# Patient Record
Sex: Male | Born: 1945 | Race: White | Hispanic: No | Marital: Married
Health system: Southern US, Community
[De-identification: ages and names within clinical notes are randomized; demographics above are authoritative.]

---

## 1998-09-27 ENCOUNTER — Ambulatory Visit (HOSPITAL_COMMUNITY): Admission: RE | Admit: 1998-09-27 | Discharge: 1998-09-27 | Payer: Self-pay | Admitting: *Deleted

## 1998-09-27 ENCOUNTER — Encounter: Payer: Self-pay | Admitting: *Deleted

## 2000-03-31 ENCOUNTER — Ambulatory Visit (HOSPITAL_COMMUNITY): Admission: RE | Admit: 2000-03-31 | Discharge: 2000-03-31 | Payer: Self-pay | Admitting: Neurosurgery

## 2000-03-31 ENCOUNTER — Encounter: Payer: Self-pay | Admitting: Neurosurgery

## 2000-04-15 ENCOUNTER — Encounter: Payer: Self-pay | Admitting: Neurosurgery

## 2000-04-15 ENCOUNTER — Inpatient Hospital Stay (HOSPITAL_COMMUNITY): Admission: RE | Admit: 2000-04-15 | Discharge: 2000-04-16 | Payer: Self-pay | Admitting: Neurosurgery

## 2000-05-19 ENCOUNTER — Encounter: Payer: Self-pay | Admitting: Neurosurgery

## 2000-05-19 ENCOUNTER — Encounter: Admission: RE | Admit: 2000-05-19 | Discharge: 2000-05-19 | Payer: Self-pay | Admitting: Neurosurgery

## 2000-09-25 ENCOUNTER — Inpatient Hospital Stay (HOSPITAL_COMMUNITY): Admission: EM | Admit: 2000-09-25 | Discharge: 2000-09-27 | Payer: Self-pay | Admitting: Emergency Medicine

## 2000-12-31 ENCOUNTER — Encounter: Payer: Self-pay | Admitting: Neurosurgery

## 2000-12-31 ENCOUNTER — Encounter: Admission: RE | Admit: 2000-12-31 | Discharge: 2000-12-31 | Payer: Self-pay | Admitting: Neurosurgery

## 2001-07-16 ENCOUNTER — Encounter: Payer: Self-pay | Admitting: Neurosurgery

## 2001-07-16 ENCOUNTER — Inpatient Hospital Stay (HOSPITAL_COMMUNITY): Admission: RE | Admit: 2001-07-16 | Discharge: 2001-07-19 | Payer: Self-pay | Admitting: Neurosurgery

## 2001-11-18 ENCOUNTER — Encounter: Admission: RE | Admit: 2001-11-18 | Discharge: 2001-11-18 | Payer: Self-pay | Admitting: Neurosurgery

## 2001-11-18 ENCOUNTER — Encounter: Payer: Self-pay | Admitting: Neurosurgery

## 2004-05-01 ENCOUNTER — Ambulatory Visit: Payer: Self-pay | Admitting: Family Medicine

## 2004-05-07 ENCOUNTER — Ambulatory Visit: Payer: Self-pay | Admitting: Family Medicine

## 2004-08-14 ENCOUNTER — Encounter: Admission: RE | Admit: 2004-08-14 | Discharge: 2004-08-14 | Payer: Self-pay | Admitting: Orthopaedic Surgery

## 2004-09-17 ENCOUNTER — Ambulatory Visit: Payer: Self-pay | Admitting: Family Medicine

## 2004-09-21 ENCOUNTER — Ambulatory Visit: Payer: Self-pay | Admitting: Family Medicine

## 2004-09-26 ENCOUNTER — Ambulatory Visit: Payer: Self-pay | Admitting: Family Medicine

## 2004-10-01 ENCOUNTER — Ambulatory Visit: Payer: Self-pay | Admitting: Family Medicine

## 2004-10-16 ENCOUNTER — Ambulatory Visit: Payer: Self-pay | Admitting: Family Medicine

## 2004-10-30 ENCOUNTER — Ambulatory Visit: Payer: Self-pay | Admitting: Family Medicine

## 2004-11-29 ENCOUNTER — Ambulatory Visit: Payer: Self-pay | Admitting: Family Medicine

## 2005-04-29 ENCOUNTER — Ambulatory Visit: Payer: Self-pay | Admitting: Family Medicine

## 2005-08-20 ENCOUNTER — Encounter: Admission: RE | Admit: 2005-08-20 | Discharge: 2005-08-20 | Payer: Self-pay | Admitting: Neurosurgery

## 2007-05-02 IMAGING — CT CT CERVICAL SPINE W/O CM
3 of 5 series · 12 of 28 positions shown, 14 images · IV contrast (agent unspecified)
Comparison: Myelogram CT of 08/14/04.

CLINICAL DATA: Neck and arm pain worse on the right with some right sided weakness. 
CT SCAN OF THE CERVICAL SPINE WITHOUT CONTRAST:
TECHNIQUE: Multidetector CT imaging of the cervical spine was performed.  Multiplanar CT  image reconstructions were also generated.

[Series 3: recon 2: cervical spine · axial · 0.23mm/px · z∈[+81,+149]mm · 2 of 163 slices shown]
[im 55/163  bone]
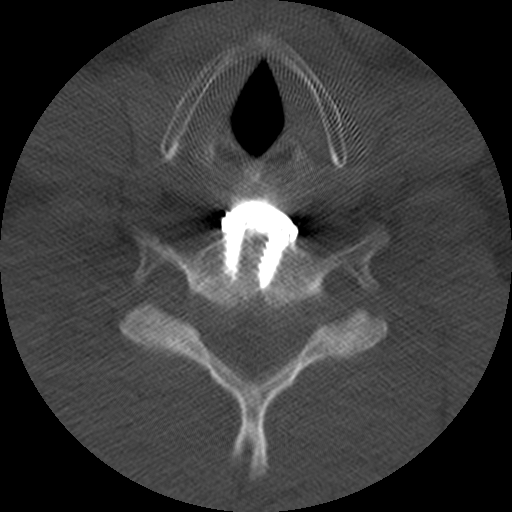
[im 109/163  bone]
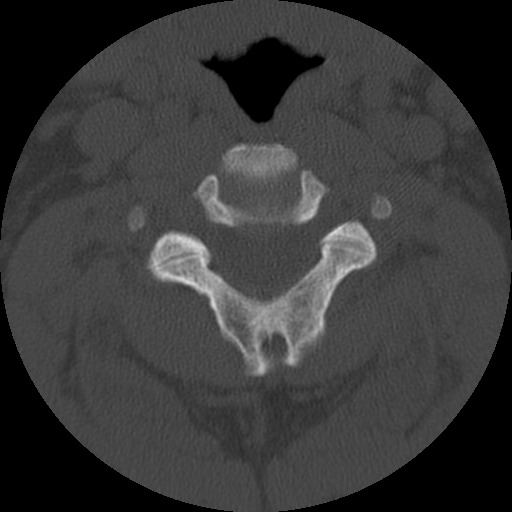

[Series 4: recon 3: cervical spine · axial · 0.23mm/px · z∈[+47,+182]mm · 5 of 325 slices shown, 7 images]
[im 55/325  soft-tissue]
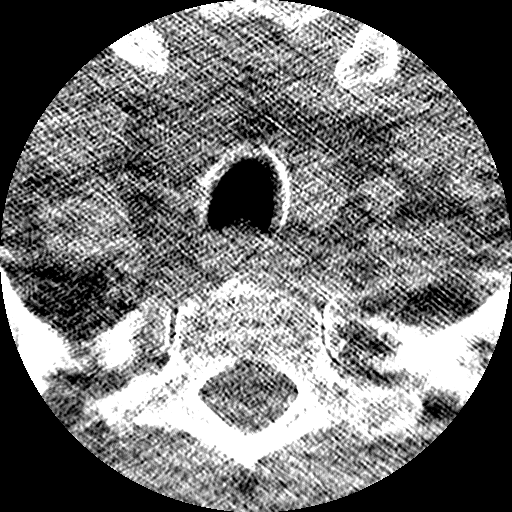
[im 55/325  bone]
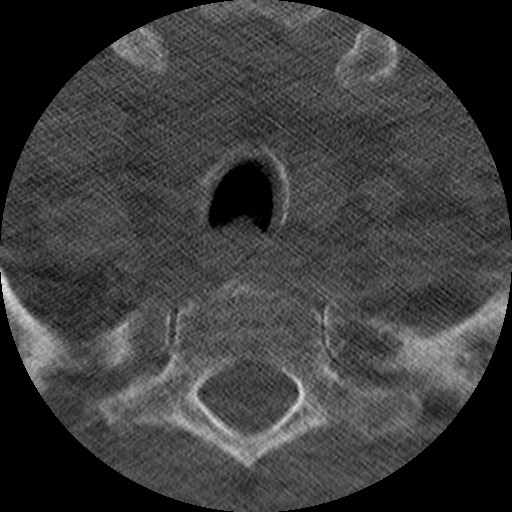
[im 109/325  bone]
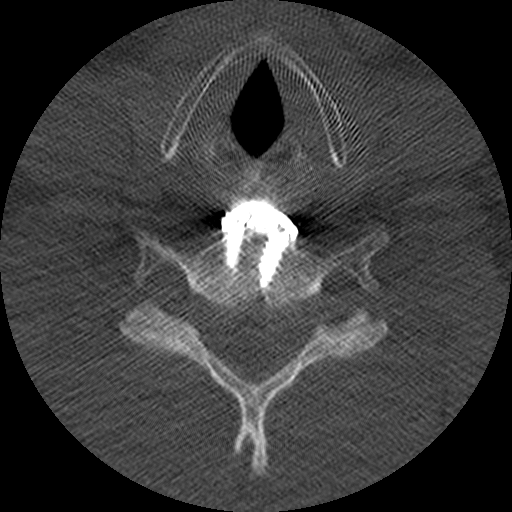
[im 163/325  bone]
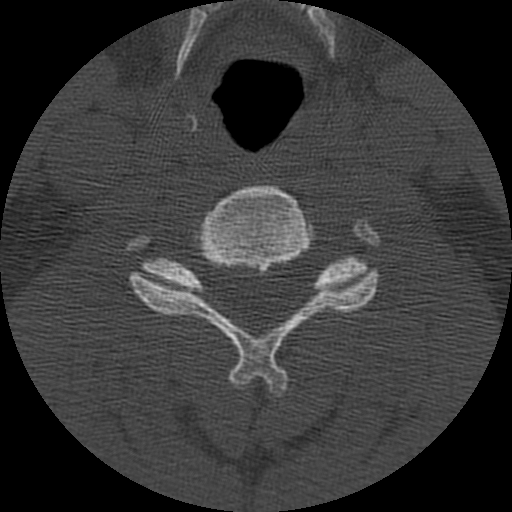
[im 217/325  bone]
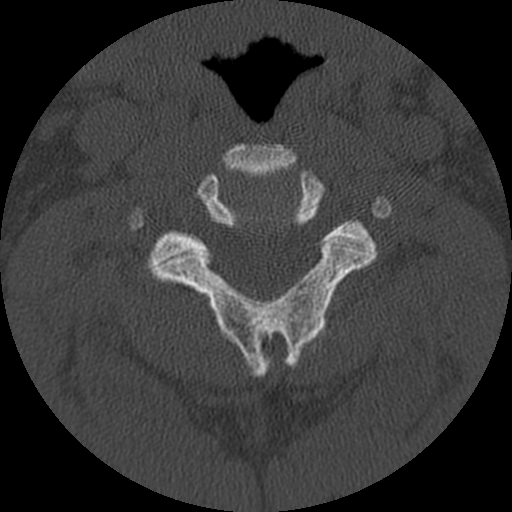
[im 271/325  soft-tissue]
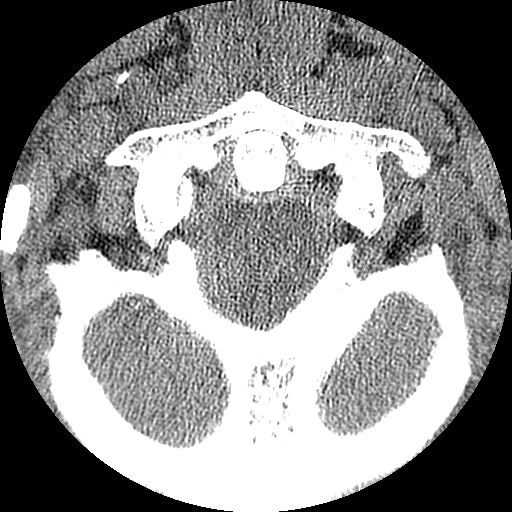
[im 271/325  bone]
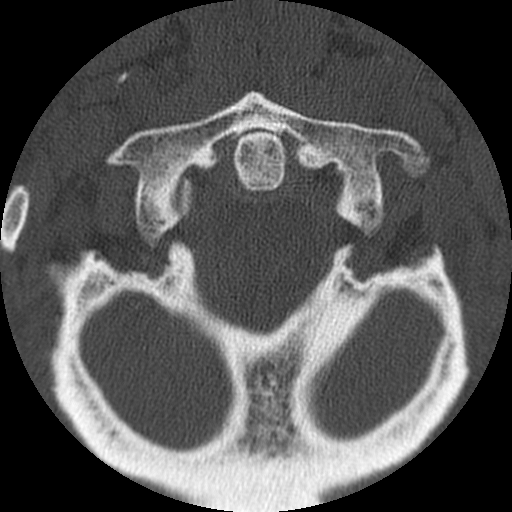

[Series 400: reformatted · sagittal · 0.41mm/px · 5 of 40 slices shown]
[im 7/40  bone]
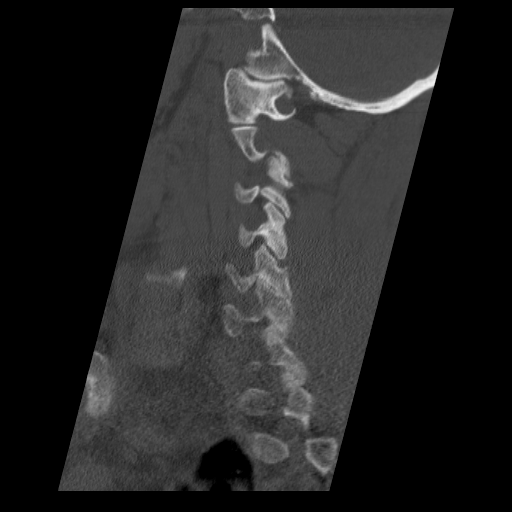
[im 14/40  bone]
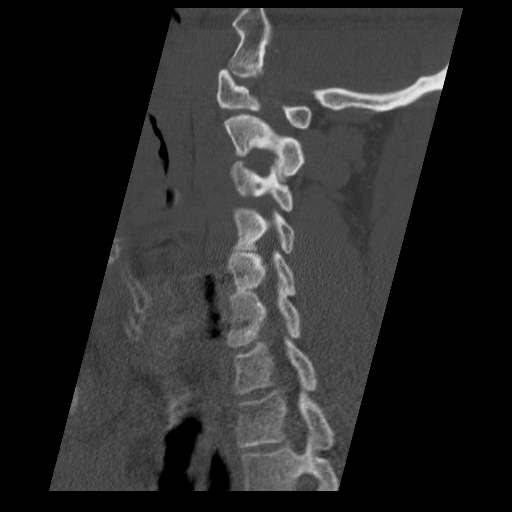
[im 20/40  bone]
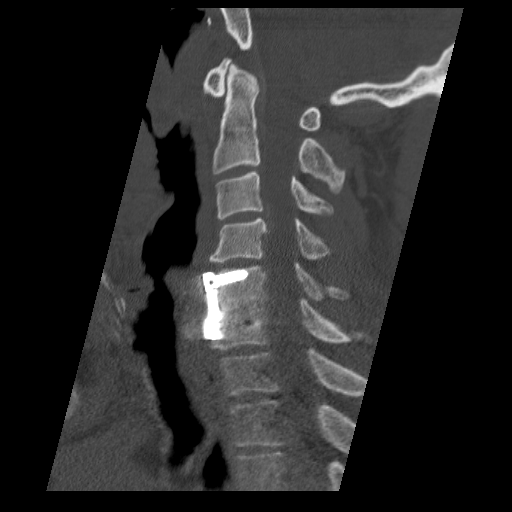
[im 27/40  bone]
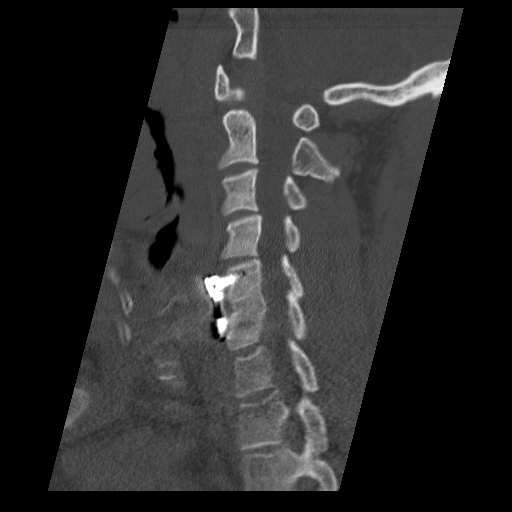
[im 33/40  bone]
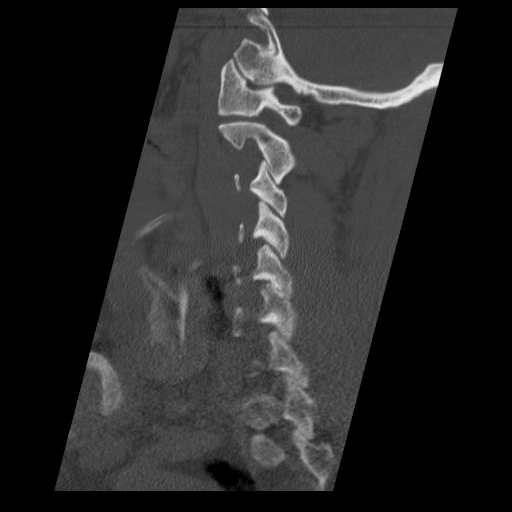

[12 of 28 positions shown; findings below may reference images not displayed]

FINDINGS: This was intended to be a post myelogram CT, but due to a mixed injection with very little subarachnoid contrast, I think this was best interpreted as a noncontrast CT.  The patient will only be charged for that procedure.  There will be no myelogram charge. 
The foramen magnum is widely patent.  There are ordinary mild degenerative changes between the dens and the anterior arch of C1.  The spinal canal is widely patent. 
C2-3:  There is a small central bulge that indents the ventral subarachnoid space, but does not compress the cord or show foraminal extension.  
C3-4:  There is a large central to left paracentral disk herniation which is calcified.  This indents the thecal sac and flattens the cord centrally and towards the left. Because the disk is calcified, we were able to compare this fairly accurately with the previous CT and I do not think there has been any changed.  No foraminal compromise.  The disk extends from the lower third of C3 down as far as the upper third of C4. 
C4-5:  There is spondylosis with endplate osteophytes and shallow protrusion of disk material that indents the ventral subarachnoid space.  There is osteophytic encroachment upon the foramina, more on the right than on the left.  
There has prior fusion at C5-6 which is solid.  The canal and foramina are widely patent.  
The C6-7 level is difficult to evaluate because of the patient?s large size and shoulder mass.  I do not suspect that there is any herniated disk material or stenosis  at this level.  The foramina appear widely patent. 
C7-T1:  Again, evaluation is difficult because the patient?s large size.  No disk pathology is suspected and the foramina appear widely patent.
IMPRESSION: 1.  This is a noncontrast CT of the neck because of difficulties described above.  Nonetheless, particularly in comparing with the myelogram of 08/14/04, I think we can be sufficiently confident of the findings.
2.  I do not think there has been any change since last [REDACTED].  The dominant finding is a central to left paracentral disk herniation at C3-4 with slight upward and downward extension of disk material.  This causes a marked impression upon the thecal sac and deforms the cord centrally and towards the left.  The disk is partially calcified.  
3.  Disk bulge at C2-3 without canal or foraminal encroachment of any significance.
4.  Spondylosis at C4-5 with osteophytes and bulging disk material that narrow the ventral subarachnoid space, but do not appear to compress or deform the cord.  There is osteophytic encroachment upon the foramina, more on the right than on the left.

## 2015-07-18 DIAGNOSIS — J9600 Acute respiratory failure, unspecified whether with hypoxia or hypercapnia: Secondary | ICD-10-CM | POA: Diagnosis not present

## 2015-07-18 DIAGNOSIS — E119 Type 2 diabetes mellitus without complications: Secondary | ICD-10-CM | POA: Diagnosis not present

## 2015-07-18 DIAGNOSIS — I4891 Unspecified atrial fibrillation: Secondary | ICD-10-CM | POA: Diagnosis not present

## 2015-07-18 DIAGNOSIS — J449 Chronic obstructive pulmonary disease, unspecified: Secondary | ICD-10-CM | POA: Diagnosis not present

## 2015-07-20 DIAGNOSIS — I4892 Unspecified atrial flutter: Secondary | ICD-10-CM

## 2015-07-20 DIAGNOSIS — J449 Chronic obstructive pulmonary disease, unspecified: Secondary | ICD-10-CM

## 2015-07-20 DIAGNOSIS — N183 Chronic kidney disease, stage 3 (moderate): Secondary | ICD-10-CM

## 2015-07-20 DIAGNOSIS — E119 Type 2 diabetes mellitus without complications: Secondary | ICD-10-CM

## 2015-07-20 DIAGNOSIS — I4891 Unspecified atrial fibrillation: Secondary | ICD-10-CM

## 2015-07-20 DIAGNOSIS — N179 Acute kidney failure, unspecified: Secondary | ICD-10-CM

## 2015-07-21 ENCOUNTER — Inpatient Hospital Stay (HOSPITAL_COMMUNITY): Payer: Commercial Managed Care - HMO

## 2015-07-21 ENCOUNTER — Inpatient Hospital Stay (HOSPITAL_COMMUNITY)
Admission: AD | Admit: 2015-07-21 | Discharge: 2015-08-05 | DRG: 871 | Disposition: E | Payer: Commercial Managed Care - HMO | Source: Other Acute Inpatient Hospital | Attending: Pulmonary Disease | Admitting: Pulmonary Disease

## 2015-07-21 ENCOUNTER — Other Ambulatory Visit (HOSPITAL_COMMUNITY): Payer: Commercial Managed Care - HMO

## 2015-07-21 DIAGNOSIS — D689 Coagulation defect, unspecified: Secondary | ICD-10-CM | POA: Diagnosis not present

## 2015-07-21 DIAGNOSIS — E1122 Type 2 diabetes mellitus with diabetic chronic kidney disease: Secondary | ICD-10-CM | POA: Diagnosis present

## 2015-07-21 DIAGNOSIS — J9601 Acute respiratory failure with hypoxia: Secondary | ICD-10-CM

## 2015-07-21 DIAGNOSIS — J449 Chronic obstructive pulmonary disease, unspecified: Secondary | ICD-10-CM | POA: Diagnosis present

## 2015-07-21 DIAGNOSIS — G9341 Metabolic encephalopathy: Secondary | ICD-10-CM | POA: Diagnosis not present

## 2015-07-21 DIAGNOSIS — F17200 Nicotine dependence, unspecified, uncomplicated: Secondary | ICD-10-CM | POA: Diagnosis present

## 2015-07-21 DIAGNOSIS — I4891 Unspecified atrial fibrillation: Secondary | ICD-10-CM | POA: Diagnosis present

## 2015-07-21 DIAGNOSIS — I5022 Chronic systolic (congestive) heart failure: Secondary | ICD-10-CM | POA: Diagnosis present

## 2015-07-21 DIAGNOSIS — Z86711 Personal history of pulmonary embolism: Secondary | ICD-10-CM

## 2015-07-21 DIAGNOSIS — Z6841 Body Mass Index (BMI) 40.0 and over, adult: Secondary | ICD-10-CM

## 2015-07-21 DIAGNOSIS — N181 Chronic kidney disease, stage 1: Secondary | ICD-10-CM | POA: Diagnosis present

## 2015-07-21 DIAGNOSIS — K7200 Acute and subacute hepatic failure without coma: Secondary | ICD-10-CM | POA: Diagnosis present

## 2015-07-21 DIAGNOSIS — J690 Pneumonitis due to inhalation of food and vomit: Secondary | ICD-10-CM | POA: Diagnosis not present

## 2015-07-21 DIAGNOSIS — R570 Cardiogenic shock: Secondary | ICD-10-CM | POA: Diagnosis not present

## 2015-07-21 DIAGNOSIS — Z66 Do not resuscitate: Secondary | ICD-10-CM | POA: Diagnosis present

## 2015-07-21 DIAGNOSIS — Z515 Encounter for palliative care: Secondary | ICD-10-CM | POA: Diagnosis present

## 2015-07-21 DIAGNOSIS — J9690 Respiratory failure, unspecified, unspecified whether with hypoxia or hypercapnia: Secondary | ICD-10-CM

## 2015-07-21 DIAGNOSIS — R34 Anuria and oliguria: Secondary | ICD-10-CM | POA: Diagnosis present

## 2015-07-21 DIAGNOSIS — D649 Anemia, unspecified: Secondary | ICD-10-CM | POA: Diagnosis present

## 2015-07-21 DIAGNOSIS — A419 Sepsis, unspecified organism: Secondary | ICD-10-CM | POA: Diagnosis not present

## 2015-07-21 DIAGNOSIS — I878 Other specified disorders of veins: Secondary | ICD-10-CM | POA: Diagnosis present

## 2015-07-21 DIAGNOSIS — N179 Acute kidney failure, unspecified: Secondary | ICD-10-CM

## 2015-07-21 DIAGNOSIS — I509 Heart failure, unspecified: Secondary | ICD-10-CM | POA: Diagnosis not present

## 2015-07-21 DIAGNOSIS — R0602 Shortness of breath: Secondary | ICD-10-CM

## 2015-07-21 DIAGNOSIS — Z4659 Encounter for fitting and adjustment of other gastrointestinal appliance and device: Secondary | ICD-10-CM

## 2015-07-21 DIAGNOSIS — R6521 Severe sepsis with septic shock: Secondary | ICD-10-CM | POA: Diagnosis not present

## 2015-07-21 DIAGNOSIS — I130 Hypertensive heart and chronic kidney disease with heart failure and stage 1 through stage 4 chronic kidney disease, or unspecified chronic kidney disease: Secondary | ICD-10-CM | POA: Diagnosis not present

## 2015-07-21 DIAGNOSIS — E872 Acidosis: Secondary | ICD-10-CM | POA: Diagnosis present

## 2015-07-21 LAB — CBC
HCT: 51.9 % (ref 39.0–52.0)
HEMOGLOBIN: 16.4 g/dL (ref 13.0–17.0)
MCH: 28 pg (ref 26.0–34.0)
MCHC: 31.6 g/dL (ref 30.0–36.0)
MCV: 88.7 fL (ref 78.0–100.0)
PLATELETS: 206 10*3/uL (ref 150–400)
RBC: 5.85 MIL/uL — AB (ref 4.22–5.81)
RDW: 15.4 % (ref 11.5–15.5)
WBC: 23 10*3/uL — AB (ref 4.0–10.5)

## 2015-07-21 LAB — COMPREHENSIVE METABOLIC PANEL
ALBUMIN: 2.7 g/dL — AB (ref 3.5–5.0)
ALK PHOS: 121 U/L (ref 38–126)
ALT: 2949 U/L — AB (ref 17–63)
ANION GAP: 28 — AB (ref 5–15)
AST: 7292 U/L — ABNORMAL HIGH (ref 15–41)
BILIRUBIN TOTAL: 3.4 mg/dL — AB (ref 0.3–1.2)
BUN: 79 mg/dL — ABNORMAL HIGH (ref 6–20)
CALCIUM: 7.4 mg/dL — AB (ref 8.9–10.3)
CO2: 21 mmol/L — AB (ref 22–32)
Chloride: 89 mmol/L — ABNORMAL LOW (ref 101–111)
Creatinine, Ser: 3.69 mg/dL — ABNORMAL HIGH (ref 0.61–1.24)
GFR calc Af Amer: 18 mL/min — ABNORMAL LOW (ref >60)
GFR calc non Af Amer: 15 mL/min — ABNORMAL LOW (ref >60)
GLUCOSE: 109 mg/dL — AB (ref 65–99)
Potassium: 4.3 mmol/L (ref 3.5–5.1)
SODIUM: 138 mmol/L (ref 135–145)
TOTAL PROTEIN: 5.6 g/dL — AB (ref 6.5–8.1)

## 2015-07-21 LAB — APTT: APTT: 58 s — AB (ref 24–37)

## 2015-07-21 LAB — POCT I-STAT 3, ART BLOOD GAS (G3+)
ACID-BASE DEFICIT: 5 mmol/L — AB (ref 0.0–2.0)
ACID-BASE DEFICIT: 9 mmol/L — AB (ref 0.0–2.0)
BICARBONATE: 16.9 meq/L — AB (ref 20.0–24.0)
Bicarbonate: 21.1 mEq/L (ref 20.0–24.0)
O2 SAT: 98 %
O2 Saturation: 97 %
PCO2 ART: 43.2 mmHg (ref 35.0–45.0)
PH ART: 7.296 — AB (ref 7.350–7.450)
TCO2: 22 mmol/L (ref 0–100)
TCO2: 18 mmol/L (ref 0–100)
pCO2 arterial: 35.5 mmHg (ref 35.0–45.0)
pH, Arterial: 7.284 — ABNORMAL LOW (ref 7.350–7.450)
pO2, Arterial: 126 mmHg — ABNORMAL HIGH (ref 80.0–100.0)
pO2, Arterial: 95 mmHg (ref 80.0–100.0)

## 2015-07-21 LAB — LACTIC ACID, PLASMA
LACTIC ACID, VENOUS: 8.7 mmol/L — AB (ref 0.5–2.0)
LACTIC ACID, VENOUS: 11 mmol/L — AB (ref 0.5–2.0)

## 2015-07-21 LAB — RENAL FUNCTION PANEL
Albumin: 2.6 g/dL — ABNORMAL LOW (ref 3.5–5.0)
Anion gap: 27 — ABNORMAL HIGH (ref 5–15)
BUN: 84 mg/dL — ABNORMAL HIGH (ref 6–20)
CO2: 16 mmol/L — ABNORMAL LOW (ref 22–32)
Calcium: 6.5 mg/dL — ABNORMAL LOW (ref 8.9–10.3)
Chloride: 92 mmol/L — ABNORMAL LOW (ref 101–111)
Creatinine, Ser: 4 mg/dL — ABNORMAL HIGH (ref 0.61–1.24)
GFR calc Af Amer: 16 mL/min — ABNORMAL LOW (ref >60)
GFR calc non Af Amer: 14 mL/min — ABNORMAL LOW (ref >60)
Glucose, Bld: 80 mg/dL (ref 65–99)
Phosphorus: 10.6 mg/dL — ABNORMAL HIGH (ref 2.5–4.6)
Potassium: 4.6 mmol/L (ref 3.5–5.1)
Sodium: 135 mmol/L (ref 135–145)

## 2015-07-21 LAB — CARBOXYHEMOGLOBIN
Carboxyhemoglobin: 1.2 % (ref 0.5–1.5)
METHEMOGLOBIN: 0.5 % (ref 0.0–1.5)
O2 SAT: 83.4 %
TOTAL HEMOGLOBIN: 18 g/dL (ref 13.5–18.0)

## 2015-07-21 LAB — DIC (DISSEMINATED INTRAVASCULAR COAGULATION)PANEL
D-Dimer, Quant: >20 ug/mL-FEU — ABNORMAL HIGH (ref 0.00–0.50)
Fibrinogen: 208 mg/dL (ref 204–475)
Prothrombin Time: 36.5 seconds — ABNORMAL HIGH (ref 11.6–15.2)

## 2015-07-21 LAB — AMYLASE: Amylase: 339 U/L — ABNORMAL HIGH (ref 28–100)

## 2015-07-21 LAB — DIC (DISSEMINATED INTRAVASCULAR COAGULATION) PANEL
APTT: 40 s — AB (ref 24–37)
INR: 3.8 — AB (ref 0.00–1.49)
PLATELETS: 177 10*3/uL (ref 150–400)

## 2015-07-21 LAB — HEPARIN LEVEL (UNFRACTIONATED): HEPARIN UNFRACTIONATED: 0.1 [IU]/mL — AB (ref 0.30–0.70)

## 2015-07-21 LAB — VANCOMYCIN, RANDOM: VANCOMYCIN RM: 28 ug/mL

## 2015-07-21 LAB — TROPONIN I
TROPONIN I: 0.84 ng/mL — AB (ref <0.031)
TROPONIN I: 1.03 ng/mL — AB (ref <0.031)
Troponin I: 0.57 ng/mL (ref <0.031)

## 2015-07-21 LAB — MAGNESIUM: Magnesium: 1.9 mg/dL (ref 1.7–2.4)

## 2015-07-21 LAB — GLUCOSE, CAPILLARY
GLUCOSE-CAPILLARY: 126 mg/dL — AB (ref 65–99)
GLUCOSE-CAPILLARY: 74 mg/dL (ref 65–99)
GLUCOSE-CAPILLARY: 76 mg/dL (ref 65–99)
Glucose-Capillary: 103 mg/dL — ABNORMAL HIGH (ref 65–99)

## 2015-07-21 LAB — CORTISOL: Cortisol, Plasma: >100 ug/dL

## 2015-07-21 LAB — PROTIME-INR
INR: 3.02 — ABNORMAL HIGH (ref 0.00–1.49)
PROTHROMBIN TIME: 30.7 s — AB (ref 11.6–15.2)

## 2015-07-21 LAB — LIPASE, BLOOD: Lipase: 172 U/L — ABNORMAL HIGH (ref 11–51)

## 2015-07-21 LAB — PHOSPHORUS: Phosphorus: 10.9 mg/dL — ABNORMAL HIGH (ref 2.5–4.6)

## 2015-07-21 LAB — PROCALCITONIN: Procalcitonin: 2.17 ng/mL

## 2015-07-21 LAB — MRSA PCR SCREENING: MRSA BY PCR: NEGATIVE

## 2015-07-21 MED ORDER — HEPARIN BOLUS VIA INFUSION (CRRT): 4000.0000 [IU] | INTRAVENOUS | Status: DC | PRN

## 2015-07-21 MED ORDER — PRISMASOL BGK 4/2.5 32-4-2.5 MEQ/L IV SOLN
INTRAVENOUS | Status: DC
  Administered 2015-07-22 (×2): via INTRAVENOUS_CENTRAL
  Filled 2015-07-21 (×5): qty 5000

## 2015-07-21 MED ORDER — CHLORHEXIDINE GLUCONATE 0.12% ORAL RINSE (MEDLINE KIT)
15.0000 mL | Freq: Two times a day (BID) | OROMUCOSAL | Status: DC
  Administered 2015-07-21: 15 mL via OROMUCOSAL

## 2015-07-21 MED ORDER — INSULIN ASPART 100 UNIT/ML ~~LOC~~ SOLN
0.0000 [IU] | SUBCUTANEOUS | Status: DC
  Administered 2015-07-21: 2 [IU] via SUBCUTANEOUS

## 2015-07-21 MED ORDER — HYDROCORTISONE NA SUCCINATE PF 100 MG IJ SOLR
50.0000 mg | Freq: Four times a day (QID) | INTRAMUSCULAR | Status: DC
  Administered 2015-07-21 – 2015-07-22 (×6): 50 mg via INTRAVENOUS
  Filled 2015-07-21 (×3): qty 1
  Filled 2015-07-21: qty 2
  Filled 2015-07-21 (×2): qty 1

## 2015-07-21 MED ORDER — MIDAZOLAM HCL 2 MG/2ML IJ SOLN: 1.0000 mg | INTRAMUSCULAR | Status: DC | PRN

## 2015-07-21 MED ORDER — SODIUM CHLORIDE 0.9 % IJ SOLN
250.0000 [IU]/h | INTRAMUSCULAR | Status: DC
  Filled 2015-07-21: qty 2

## 2015-07-21 MED ORDER — PRISMASOL BGK 4/2.5 32-4-2.5 MEQ/L IV SOLN
INTRAVENOUS | Status: DC
  Administered 2015-07-21 – 2015-07-22 (×8): via INTRAVENOUS_CENTRAL
  Filled 2015-07-21 (×15): qty 5000

## 2015-07-21 MED ORDER — ANTISEPTIC ORAL RINSE SOLUTION (CORINZ)
7.0000 mL | Freq: Four times a day (QID) | OROMUCOSAL | Status: DC
Start: 2015-07-22 — End: 2015-07-21

## 2015-07-21 MED ORDER — HEPARIN BOLUS VIA INFUSION (CRRT)
4000.0000 [IU] | Freq: Once | INTRAVENOUS | Status: AC
  Administered 2015-07-21: 4000 [IU] via INTRAVENOUS_CENTRAL
  Filled 2015-07-21: qty 4000

## 2015-07-21 MED ORDER — SODIUM CHLORIDE 0.9 % IV SOLN
INTRAVENOUS | Status: DC
  Administered 2015-07-21 – 2015-07-22 (×2): via INTRAVENOUS

## 2015-07-21 MED ORDER — PRISMASOL BGK 4/2.5 32-4-2.5 MEQ/L IV SOLN
INTRAVENOUS | Status: DC
  Filled 2015-07-21: qty 5000

## 2015-07-21 MED ORDER — PIPERACILLIN-TAZOBACTAM IN DEX 2-0.25 GM/50ML IV SOLN
2.2500 g | Freq: Four times a day (QID) | INTRAVENOUS | Status: DC
  Administered 2015-07-21 – 2015-07-22 (×4): 2.25 g via INTRAVENOUS
  Filled 2015-07-21 (×6): qty 50

## 2015-07-21 MED ORDER — PERFLUTREN LIPID MICROSPHERE
1.0000 mL | INTRAVENOUS | Status: AC | PRN
  Administered 2015-07-21: 2 mL via INTRAVENOUS
  Filled 2015-07-21: qty 10

## 2015-07-21 MED ORDER — CHLORHEXIDINE GLUCONATE 0.12% ORAL RINSE (MEDLINE KIT)
15.0000 mL | Freq: Two times a day (BID) | OROMUCOSAL | Status: DC
Start: 2015-07-21 — End: 2015-07-22
  Administered 2015-07-22: 15 mL via OROMUCOSAL

## 2015-07-21 MED ORDER — ANTISEPTIC ORAL RINSE SOLUTION (CORINZ)
7.0000 mL | OROMUCOSAL | Status: DC
  Administered 2015-07-21 – 2015-07-22 (×9): 7 mL via OROMUCOSAL

## 2015-07-21 MED ORDER — FAMOTIDINE IN NACL 20-0.9 MG/50ML-% IV SOLN
20.0000 mg | INTRAVENOUS | Status: DC
  Administered 2015-07-21 – 2015-07-22 (×2): 20 mg via INTRAVENOUS
  Filled 2015-07-21 (×2): qty 50

## 2015-07-21 MED ORDER — HEPARIN (PORCINE) 2000 UNITS/L FOR CRRT
INTRAVENOUS_CENTRAL | Status: DC | PRN
  Administered 2015-07-21 (×2): via INTRAVENOUS_CENTRAL
  Filled 2015-07-21 (×3): qty 1000

## 2015-07-21 MED ORDER — VASOPRESSIN 20 UNIT/ML IV SOLN
0.0300 [IU]/min | INTRAVENOUS | Status: DC
  Administered 2015-07-21 – 2015-07-22 (×2): 0.03 [IU]/min via INTRAVENOUS
  Filled 2015-07-21 (×2): qty 2

## 2015-07-21 MED ORDER — PIPERACILLIN-TAZOBACTAM 3.375 G IVPB
3.3750 g | Freq: Three times a day (TID) | INTRAVENOUS | Status: DC
Start: 2015-07-21 — End: 2015-07-21
  Administered 2015-07-21: 3.375 g via INTRAVENOUS
  Filled 2015-07-21 (×3): qty 50

## 2015-07-21 MED ORDER — NOREPINEPHRINE BITARTRATE 1 MG/ML IV SOLN
0.0000 ug/min | INTRAVENOUS | Status: DC
  Administered 2015-07-21: 40 ug/min via INTRAVENOUS
  Administered 2015-07-21: 30 ug/min via INTRAVENOUS
  Administered 2015-07-22 (×2): 38 ug/min via INTRAVENOUS
  Filled 2015-07-21 (×5): qty 16

## 2015-07-21 MED ORDER — FENTANYL CITRATE (PF) 100 MCG/2ML IJ SOLN: 50.0000 ug | INTRAMUSCULAR | Status: DC | PRN

## 2015-07-21 MED ORDER — NOREPINEPHRINE BITARTRATE 1 MG/ML IV SOLN
5.0000 ug/min | INTRAVENOUS | Status: DC
  Filled 2015-07-21: qty 4

## 2015-07-21 MED ORDER — PRISMASOL BGK 4/2.5 32-4-2.5 MEQ/L IV SOLN
INTRAVENOUS | Status: DC
  Administered 2015-07-21: 14:00:00 via INTRAVENOUS_CENTRAL
  Filled 2015-07-21: qty 5000

## 2015-07-21 MED ORDER — HEPARIN BOLUS VIA INFUSION (CRRT)
1000.0000 [IU] | INTRAVENOUS | Status: DC | PRN
  Filled 2015-07-21: qty 1000

## 2015-07-21 MED ORDER — PRISMASOL BGK 4/2.5 32-4-2.5 MEQ/L IV SOLN
INTRAVENOUS | Status: DC
  Filled 2015-07-21 (×4): qty 5000

## 2015-07-21 MED ORDER — VECURONIUM BROMIDE 10 MG IV SOLR
7.0000 mg | Freq: Once | INTRAVENOUS | Status: AC
  Administered 2015-07-21: 7 mg via INTRAVENOUS

## 2015-07-21 MED ORDER — IPRATROPIUM-ALBUTEROL 0.5-2.5 (3) MG/3ML IN SOLN
3.0000 mL | Freq: Four times a day (QID) | RESPIRATORY_TRACT | Status: DC
  Administered 2015-07-21 – 2015-07-22 (×6): 3 mL via RESPIRATORY_TRACT
  Filled 2015-07-21 (×5): qty 3
  Filled 2015-07-21: qty 42
  Filled 2015-07-21: qty 3

## 2015-07-21 MED ORDER — HEPARIN SODIUM (PORCINE) 1000 UNIT/ML DIALYSIS
1000.0000 [IU] | INTRAMUSCULAR | Status: DC | PRN
  Administered 2015-07-21: 3200 [IU] via INTRAVENOUS_CENTRAL
  Filled 2015-07-21 (×2): qty 6
  Filled 2015-07-21: qty 4

## 2015-07-21 MED ORDER — FENTANYL CITRATE (PF) 100 MCG/2ML IJ SOLN
50.0000 ug | INTRAMUSCULAR | Status: DC | PRN
  Administered 2015-07-21: 50 ug via INTRAVENOUS
  Filled 2015-07-21: qty 2

## 2015-07-21 MED ORDER — SODIUM CHLORIDE 0.9 % IJ SOLN
250.0000 [IU]/h | INTRAMUSCULAR | Status: DC
  Administered 2015-07-21: 1000 [IU]/h via INTRAVENOUS_CENTRAL
  Administered 2015-07-21: 250 [IU]/h via INTRAVENOUS_CENTRAL
  Administered 2015-07-22: 1300 [IU]/h via INTRAVENOUS_CENTRAL
  Administered 2015-07-22: 1600 [IU]/h via INTRAVENOUS_CENTRAL
  Administered 2015-07-22: 1650 [IU]/h via INTRAVENOUS_CENTRAL
  Filled 2015-07-21 (×4): qty 2

## 2015-07-21 MED ORDER — ONDANSETRON HCL 4 MG/2ML IJ SOLN: 4.0000 mg | Freq: Four times a day (QID) | INTRAMUSCULAR | Status: DC | PRN

## 2015-07-21 MED ORDER — HEPARIN SODIUM (PORCINE) 1000 UNIT/ML DIALYSIS
1000.0000 [IU] | INTRAMUSCULAR | Status: DC | PRN
  Filled 2015-07-21: qty 6

## 2015-07-21 MED ORDER — HEPARIN SODIUM (PORCINE) 1000 UNIT/ML DIALYSIS
1000.0000 [IU] | INTRAMUSCULAR | Status: DC | PRN
  Administered 2015-07-21: 3200 [IU] via INTRAVENOUS_CENTRAL
  Filled 2015-07-21: qty 4
  Filled 2015-07-21 (×2): qty 6

## 2015-07-21 MED ORDER — VECURONIUM BROMIDE 10 MG IV SOLR
INTRAVENOUS | Status: AC
  Filled 2015-07-21: qty 10

## 2015-07-21 NOTE — Progress Notes (Signed)
Echocardiogram 2D Echocardiogram has been performed.  Nathan Eaton 07/11/2015, 4:47 PM

## 2015-07-21 NOTE — H&P (Signed)
PULMONARY / CRITICAL CARE MEDICINE   Name: Nathan Eaton MRN: [id number] DOB: 07-Dec-1945    ADMISSION DATE:  07/30/2015   REF[place]ING MD: [place]  CHIEF COMPLAINT:  Sepsis  HISTORY OF PRESENT ILLNESS:   70 yo WM with an extensive PMH that includes  CHF ef 30%, DM, HTN, MO 297 ibs, COPD still smokes, venous stasis, PE on xarelto, Afib with RVR. He [facility] with 30 day hx of increasing lower ext edema and SOB. On 6/15 he rapidly declined and developed shock(presumed sepsis from aspiration)and required intubation and pressors. He has baseline creatine of 1.4 and rose to 2.3. He was transferred via [facility] to MICU on 40 mcg of levo, vasopressin and acidotic.   PAST MEDICAL HISTORY :  He  has no past medical history on file.  PAST SURGICAL HISTORY: He  has no past surgical history on file.  Allergies  Allergen Reactions  . Pravachol [Pravastatin Sodium]     No current facility-administered medications on file prior to encounter.   No current outpatient prescriptions on file prior to encounter.    FAMILY HISTORY:  His has no family status information on file.   SOCIAL HISTORY: Smoker Married REVIEW OF SYSTEMS:   NA  SUBJECTIVE:  MOWM on vent  VITAL SIGNS: There were no vitals taken for this visit.  HEMODYNAMICS:    VENTILATOR SETTINGS:    INTAKE / OUTPUT:    PHYSICAL EXAMINATION: General:MOWM on vent and sedated Neuro:  Follows some commands, mae x 4 HEENT:  OTT-> vent, OGT LIWS, Full beard Cardiovascular:  HSR RRR Lungs:Coarse rhonchi, decreased bs bases, faint wheezes Abdomen:Obese +bs Musculoskeletal:  intact Skin:  Lower ext with venous statis , weeping ulcers, no palpable lower ext pulses.  LABS:  BMET No results for input(s): NA, K, CL, CO2, BUN, CREATININE, GLUCOSE in the last 168 hours.  Electrolytes No results for input(s): CALCIUM, MG, PHOS in the last 168 hours.  CBC No results for input(s): WBC, HGB,  HCT, PLT in the last 168 hours.  Coag's No results for input(s): APTT, INR in the last 168 hours.  Sepsis Markers No results for input(s): LATICACIDVEN, PROCALCITON, O2SATVEN in the last 168 hours.  ABG  Recent Labs Lab 07/12/2015 0752  PHART 7.296*  PCO2ART 43.2  PO2ART 126.0*    Liver Enzymes No results for input(s): AST, ALT, ALKPHOS, BILITOT, ALBUMIN in the last 168 hours.  Cardiac Enzymes No results for input(s): TROPONINI, PROBNP in the last 168 hours.  Glucose No results for input(s): GLUCAP in the last 168 hours.  Imaging No results found.   STUDIES:  6/16 2 d>> 6/16 le a/v doppler>>   CULTURES: 6/15 bc x 2>> 6/15 uc> 6/15 sputum>>  ANTIBIOTICS: 6/15 vanco>> 6/15 zoysn>>  SIGNIFICANT EVENTS: 6/13 [facility] with pna resp distress 6/15 required intubation , pressor support and tx to [facility].  LINES/TUBES: 6/15 ott>> 6/15 rij cvl>> 6/15 rt fem aline>>  DISCUSSION: 70 yo MO (296 lbs) with multiple medical problems with shock, sepsis, pna, renal failure, tx from [place] [facility] 6/16  ASSESSMENT / PLAN:  PULMONARY A: Acute hypoxic resp failure Presumed aspiration pna COPD with continued tobacco abuse Asthma P:   Increase rr on vent for acidosis Vent bundle BD's  CARDIOVASCULAR A:  Shock from presumed sepsis CHF Known ef of 30 % Afib with RVR Lower ext venous statis and no pulse P:  Pressor support Careful with fluids Recheck 2 d Add  solu cortef for adrenal suppresion Lower ext doppler studies   RENAL No results found for: CREATININE A:   Acute renal failure with base line creatine 1.8 P:   Follow creatine Renal consult Hold heparin for possible HD cath  GASTROINTESTINAL A:   GI protection P:   H2 blocker  HEMATOLOGIC A:   Anticoagulation for PE P:  Heparin per pharmacy   INFECTIOUS A:   Aspiration pneumonia Weeping lower extremities  P:   V/Z day 0 Pan culture 6/16  ENDOCRINE A:    DM  Adrenal suppresion P:   SSI Solu cortef   NEUROLOGIC A:   Toxic metabolic encephalopathy  P:   RASS goal:-1 Sedate for tube tolerance   FAMILY  - Updates:   - Inter-disciplinary family meet or Palliative Care meeting due by:  June 23    Brett Canales Minor ACNP [facility] PCCM Pager [phone] till 3 pm If no answer page [phone] 07/11/2015, 8:03 AM

## 2015-07-21 NOTE — Progress Notes (Signed)
Notified MD [place] Nephrologist in regards to having issues running CRRT. Filters have clotted 3 times, even with priming with heparinized saline and utilizing heparinized syringe via the CRRT for anticoagulation. HD cath seems to operating correctly, able to flush and draw blood back. However when machine is starting to run, then filter begins to clot. After disconnecting Access and Return lines from HD cath, there has been clots forming at the end of line going to the CRRT and the HD cath ports. MD Kathrene BongoG[place]borough ordered a DIC and after results begin to come back, restart CRRT. May have to bolus with heparinized syringe initially.

## 2015-07-21 NOTE — Consult Note (Signed)
[facility] Renal Consultation Note  Requesting MD: Elsworth Soho Indication for Consultation: A on CRF in the setting of critical illness  HPI:  Nathan Eaton is a 70 y.o. male with past medical history significant for diabetes mellitus, hypertension, obesity, COPD with continued tobacco abuse, CHF with ejection fraction of 30%, PE on Xarelto, A. fib as well as chronic venous stasis with ulcers. He was admitted to [facility] on 6/13 with a several week history of increasing lower extremity edema and shortness of breath. He was noted to be on Lasix, lisinopril, and Zaroxolyn at that time. His initial creatinine was 1.4. On 6/15 he clinically declined and developed shock that was presumed to be sepsis from aspiration and required intubation and pressors. He also underwent a contrasted CT somewhere around this time.   He reportedly has been anuric for 24 hours- creatinine had increased to 2.3 and is noted to be 3.7 on his arrival here to [facility].  He is continued to be markedly hypotensive and has had no urine output.  History is obtained from the chart as patient is sedated on the vent.  Also of note vancomycin level was 28  CREATININE, SER  Date/Time Value Ref Range Status  07/28/2015 08:00 AM 3.69* 0.61 - 1.24 mg/dL Final     PMHx:  No past medical history on file.  No past surgical history on file.  Family Hx: No family history on file.  Social History:  has no tobacco, alcohol, and drug history on file.  Allergies:  Allergies  Allergen Reactions  . Pravachol [Pravastatin Sodium]     Medications: Prior to Admission medications   Not on File    I have reviewed the patient's current medications.  Labs:  Results for orders placed or performed during the hospital encounter of 07/13/2015 (from the past 48 hour(s))  I-STAT 3, arterial blood gas (G3+)     Status: Abnormal   Collection Time: 07/27/2015  7:52 AM  Result Value Ref Range   pH, Arterial 7.296 (L) 7.350 -  7.450   pCO2 arterial 43.2 35.0 - 45.0 mmHg   pO2, Arterial 126.0 (H) 80.0 - 100.0 mmHg   Bicarbonate 21.1 20.0 - 24.0 mEq/L   TCO2 22 0 - 100 mmol/L   O2 Saturation 98.0 %   Acid-base deficit 5.0 (H) 0.0 - 2.0 mmol/L   Patient temperature HIDE    Collection site ARTERIAL LINE    Drawn by Operator    Sample type ARTERIAL   MRSA PCR Screening     Status: None   Collection Time: 07/14/2015  7:54 AM  Result Value Ref Range   MRSA by PCR NEGATIVE NEGATIVE    Comment:        The GeneXpert MRSA Assay (FDA approved for NASAL specimens only), is one component of a comprehensive MRSA colonization surveillance program. It is not intended to diagnose MRSA infection nor to guide or monitor treatment for MRSA infections.   Comprehensive metabolic panel     Status: Abnormal   Collection Time: 07/12/2015  8:00 AM  Result Value Ref Range   Sodium 138 135 - 145 mmol/L   Potassium 4.3 3.5 - 5.1 mmol/L   Chloride 89 (L) 101 - 111 mmol/L   CO2 21 (L) 22 - 32 mmol/L   Glucose, Bld 109 (H) 65 - 99 mg/dL   BUN 79 (H) 6 - 20 mg/dL   Creatinine, Ser 3.69 (H) 0.61 - 1.24 mg/dL   Calcium 7.4 (L) 8.9 -  10.3 mg/dL   Total Protein 5.6 (L) 6.5 - 8.1 g/dL   Albumin 2.7 (L) 3.5 - 5.0 g/dL   AST 7292 (H) 15 - 41 U/L    Comment: RESULTS CONFIRMED BY MANUAL DILUTION   ALT 2949 (H) 17 - 63 U/L    Comment: RESULTS CONFIRMED BY MANUAL DILUTION   Alkaline Phosphatase 121 38 - 126 U/L   Total Bilirubin 3.4 (H) 0.3 - 1.2 mg/dL   GFR calc non Af Amer 15 (L) >60 mL/min   GFR calc Af Amer 18 (L) >60 mL/min    Comment: (NOTE) The eGFR has been calculated using the CKD EPI equation. This calculation has not been validated in all clinical situations. eGFR's persistently <60 mL/min signify possible Chronic Kidney Disease.    Anion gap 28 (H) 5 - 15  Magnesium     Status: None   Collection Time: 07/30/2015  8:00 AM  Result Value Ref Range   Magnesium 1.9 1.7 - 2.4 mg/dL  Phosphorus     Status: Abnormal    Collection Time: 07/31/2015  8:00 AM  Result Value Ref Range   Phosphorus 10.9 (H) 2.5 - 4.6 mg/dL  Lactic acid, plasma     Status: Abnormal   Collection Time: 07/14/2015  8:00 AM  Result Value Ref Range   Lactic Acid, Venous 8.7 (HH) 0.5 - 2.0 mmol/L    Comment: CRITICAL RESULT CALLED TO, READ BACK BY AND VERIFIED WITH: Renette Butters [id number] [facility]   Cortisol     Status: None   Collection Time: 08/01/2015  8:00 AM  Result Value Ref Range   Cortisol, Plasma >100.0 ug/dL    Comment: RESULTS CONFIRMED BY MANUAL DILUTION (NOTE) AM    6.7 - 22.6 ug/dL PM   <10.0       ug/dL   CBC     Status: Abnormal   Collection Time: 07/16/2015  8:00 AM  Result Value Ref Range   WBC 23.0 (H) 4.0 - 10.5 K/uL   RBC 5.85 (H) 4.22 - 5.81 MIL/uL   Hemoglobin 16.4 13.0 - 17.0 g/dL   HCT 51.9 39.0 - 52.0 %   MCV 88.7 78.0 - 100.0 fL   MCH 28.0 26.0 - 34.0 pg   MCHC 31.6 30.0 - 36.0 g/dL   RDW 15.4 11.5 - 15.5 %   Platelets 206 150 - 400 K/uL  Protime-INR     Status: Abnormal   Collection Time: 07/15/2015  8:00 AM  Result Value Ref Range   Prothrombin Time 30.7 (H) 11.6 - 15.2 seconds   INR 3.02 (H) 0.00 - 1.49  APTT     Status: Abnormal   Collection Time: 07/18/2015  8:00 AM  Result Value Ref Range   aPTT 58 (H) 24 - 37 seconds    Comment:        IF BASELINE aPTT IS ELEVATED, SUGGEST PATIENT RISK ASSESSMENT BE USED TO DETERMINE APPROPRIATE ANTICOAGULANT THERAPY.   Troponin I     Status: Abnormal   Collection Time: 07/10/2015  8:00 AM  Result Value Ref Range   Troponin I 0.57 (HH) <0.031 ng/mL    Comment:        POSSIBLE MYOCARDIAL ISCHEMIA. SERIAL TESTING RECOMMENDED. CRITICAL RESULT CALLED TO, READ BACK BY AND VERIFIED WITH: Renette Butters [id number] WILDERK   Lipase, blood     Status: Abnormal   Collection Time: 07/27/2015  8:00 AM  Result Value Ref Range   Lipase 172 (H) 11 - 51 U/L  Amylase  Status: Abnormal   Collection Time: 07/14/2015  8:00 AM  Result Value Ref Range   Amylase 339 (H) 28  - 100 U/L  Heparin level (unfractionated)     Status: Abnormal   Collection Time: 07/25/2015  8:00 AM  Result Value Ref Range   Heparin Unfractionated 0.10 (L) 0.30 - 0.70 IU/mL    Comment:        IF HEPARIN RESULTS ARE BELOW EXPECTED VALUES, AND PATIENT DOSAGE HAS BEEN CONFIRMED, SUGGEST FOLLOW UP TESTING OF ANTITHROMBIN III LEVELS.   Glucose, capillary     Status: Abnormal   Collection Time: 07/15/2015  8:36 AM  Result Value Ref Range   Glucose-Capillary 126 (H) 65 - 99 mg/dL  Vancomycin, random     Status: None   Collection Time: 07/25/2015 10:35 AM  Result Value Ref Range   Vancomycin Rm 28 ug/mL    Comment:        Random Vancomycin therapeutic range is dependent on dosage and time of specimen collection. A peak range is 20.0-40.0 ug/mL A trough range is 5.0-15.0 ug/mL          Carboxyhemoglobin     Status: None   Collection Time: 07/11/2015 10:35 AM  Result Value Ref Range   Total hemoglobin 18.0 13.5 - 18.0 g/dL   O2 Saturation 83.4 %   Carboxyhemoglobin 1.2 0.5 - 1.5 %   Methemoglobin 0.5 0.0 - 1.5 %  Glucose, capillary     Status: Abnormal   Collection Time: 07/19/2015 11:51 AM  Result Value Ref Range   Glucose-Capillary 103 (H) 65 - 99 mg/dL     ROS:  Review of systems not obtained due to patient factors.  Physical Exam: Filed Vitals:   07/20/2015 0839 07/19/2015 1152  BP:    Pulse:    Temp: 97.9 F (36.6 C) 97.8 F (36.6 C)  Resp:       General: Sedated on the ventilator, appears chronically ill HEENT: Pupils are equally round and reactive to light, extra motions are intact, mucous members are moist Neck: JVD is difficult to determine Heart: Tachycardic Lungs: Coarse breath sounds bilaterally Abdomen: Obese, nontender Extremities: Pitting edema and distal discoloration with small superficial ulcers consistent with venous stasis Skin: Warm and dry Neuro: Sedated  Assessment/Plan: 70 year old white male with multiple medical issues with an acute clinical  decline the sustaining intubation and pressor support with accompanying acute kidney injury on chronic kidney failure 1.Renal- acute on chronic renal failure in the setting of acute critical illness. I suspect secondary to ATN - previously on lisinopril, also contrast, and supratherapeutic vancomycin level.  Kidney function labs are worsening rapidly. Patient is an anuric.  Hemodialysis accesses in place. I will go ahead and initiate CRRT running even at this time due to uremia and a little bit of metabolic acidosis 2. Hypertension/volume  - do not suspect he is dry. However, is hypertensive and tachycardic receiving fluids per sepsis protocol- will run even with CRRT- CCM can bolus outside the machine 3. Anemia  - does not appear to be an issue at this time- we will use heparin with CRRT 4. Sepsis- elevated WBC- unknown source on zosyn and vanc 5. Elytes- phos is up- CRRT will help clear- K is OK - will run on 4 K bath - corrected calcium is OK   Thank you for this consult , I will continue to follow with you   Nathan Eaton A 07/14/2015, 12:13 PM

## 2015-07-21 NOTE — Progress Notes (Signed)
CRITICAL VALUE ALERT  Critical value received:  Troponin 0.57 & Latic acid 8.7  Date of notification:  07/06/2015  Time of notification:  0929  Critical value read back:Yes.    Nurse who received alert:  Tory EmeraldMichelle Fread Kottke, RN  MD notified (1st page):  Brigitte PulseSteve Minor,NP  Time of first page:  [phone]  MD notified (2nd page):  Time of second page:  Responding MD:  Devra DoppSteve Minor, NP  Time MD responded:  [phone]

## 2015-07-21 NOTE — Procedures (Signed)
Hemodialysis Insertion Procedure Note Marcelo BaldyHarry W Vanderlinde [id number] February 13, 1945  Procedure: Insertion of Hemodialysis Catheter Type: 3 port  Indications: Hemodialysis   Procedure Details Consent: Risks of procedure as well as the alternatives and risks of each were explained to the (patient/caregiver).  Consent for procedure obtained. Time Out: Verified patient identification, verified procedure, site/side was marked, verified correct patient position, special equipment/implants available, medications/allergies/relevent history reviewed, required imaging and test results available.  Performed  Maximum sterile technique was used including antiseptics, cap, gloves, gown, hand hygiene, mask and sheet. Skin prep: Chlorhexidine; local anesthetic administered A antimicrobial bonded/coated triple lumen catheter was placed in the left femoral vein due to patient being a dialysis patient and emergent situation using the Seldinger technique. Ultrasound guidance used.Yes.   Catheter placed to 20 cm. Blood aspirated via all 3 ports and then flushed x 3. Line sutured x 2 and dressing applied.  Evaluation Blood flow good Complications: No apparent complications Patient did tolerate procedure well. Chest X-ray ordered to verify placement.  CXR: Not needed  [facility] Seto ACNP [facility] PCCM Pager [phone] till 3 pm If no answer page [phone] Jul 28, 2015, 10:49 AM

## 2015-07-21 NOTE — Progress Notes (Signed)
Attempted sputum sample- not able to obtain secretions at this time.

## 2015-07-21 NOTE — Progress Notes (Signed)
Initial Nutrition Assessment  DOCUMENTATION CODES:   Morbid obesity  INTERVENTION:    If unable to extubate, recommend initiate TF via OGT with Vital High Protein at goal rate of 55 ml/h (1320 ml per day) and Prostat 30 ml 5 times per day to provide 1820 kcals, 191 gm protein, 1104 ml free water daily.  NUTRITION DIAGNOSIS:   Inadequate oral intake related to inability to eat as evidenced by NPO status.  GOAL:   Provide needs based on ASPEN/SCCM guidelines  MONITOR:   Vent status, Labs, Weight trends, I & O's  REASON FOR ASSESSMENT:   Ventilator    ASSESSMENT:   70 yo male with multiple medical problems including morbid obesity, who presented with shock, sepsis, pna, renal failure. Required emergent intubation on 6/15, he also developed shock. [facility].  Discussed patient in ICU rounds and with RN today. Labs reviewed: phosphorus elevated at 10.9 CRRT initiated today. Patient is anuric with renal failure likely due to ATN per Nephrology note. Patient is currently intubated on ventilator support Temp (24hrs), Avg:97.9 F (36.6 C), Min:97.8 F (36.6 C), Max:97.9 F (36.6 C)   Diet Order:  Diet NPO time specified  Skin:  Reviewed, no issues  Last BM:  unknown  Height:   Ht Readings from Last 1 Encounters:  12-Aug-2015 5\' 10"  (1.778 m)    Weight:   Wt Readings from Last 1 Encounters:  12-Aug-2015 293 lb 3.4 oz (133 kg)  suspect weight is elevated with fluid retention  Ideal Body Weight:  75.5 kg  BMI:  Body mass index is 42.07 kg/(m^2).  Estimated Nutritional Needs:   Kcal:  [id number]  Protein:  189 gm  Fluid:  2 L  EDUCATION NEEDS:   No education needs identified at this time  Joaquin CourtsKimberly Etherine Mackowiak, RD, LDN, CNSC Pager [phone] After Hours Pager [phone]

## 2015-07-21 NOTE — Progress Notes (Signed)
CRITICAL VALUE ALERT  Critical value received: Lactic Acid 11.0  Date of notification:  2015/03/10  Time of notification:  1458  Critical value read back: Yes  Nurse who received alert: Tory EmeraldMichelle Toler RN  MD notified (1st page):  MD Craige CottaSood  Time of first page:  1525  MD notified (2nd page):  Time of second page:  Responding MD:  MD Craige CottaSood  Time MD responded:  [phone]

## 2015-07-21 NOTE — Progress Notes (Signed)
Pharmacy Antibiotic Note  Nathan Eaton is a 70 y.o. male admitted on 07/09/2015 with sepsis.  Pharmacy has been consulted for vancomycin/Zosyn dosing.  Wbc 23, afeb. PCT 2.17, lactate 11. Cr 3.69, now on CRRT. Previously on [facility]com[facility]. Last vancomycin dose 6/14 @ 1800 on vancomycin 2000 mg q18h. Vancomycin random 28 this morning. Pt with worsening renal function and anuria, starting on CRRT.   Plan: Vancomycin per level, random level ordered for AM Zosyn 2.25g q6h while on CRRT F/u cx, clinical status, renal function  Height: 5\' 10"  (177.8 cm) Weight: 293 lb 3.4 oz (133 kg) IBW/kg (Calculated) : 73  Temp (24hrs), Avg:98 F (36.7 C), Min:97.8 F (36.6 C), Max:98.2 F (36.8 C)   Recent Labs Lab 07/06/2015 0800 07/27/2015 1035 07/08/2015 1423  WBC 23.0*  --   --   CREATININE 3.69*  --   --   LATICACIDVEN 8.7*  --  11.0*  VANCORANDOM  --  28  --     Estimated Creatinine Clearance: 25.6 mL/min (by C-G formula based on Cr of 3.69).    Allergies  Allergen Reactions  . Pravachol [Pravastatin Sodium]     Antimicrobials this admission: 6/13 Zosyn >> 6/15, 6/16 >> 6/14 vanc >> 6/15 cefazolin >>  Dose adjustments this admission: Zosyn to 2.25g q6h for CRRT on 6/16  Microbiology results: [facility] cx 6/16 blood 6/16 urine 6/16 TA 6/16 MRSA screen neg  [facility]  6/13 blood NGTD 6/13 urine contaminated 6/13 MRSA screen neg  Thank you for allowing pharmacy to be a part of this patient's care.  Nathan Eaton 07/28/2015 3:49 PM

## 2015-07-21 NOTE — Progress Notes (Addendum)
CRITICAL VALUE ALERT  Critical value received: Lactic Acid 8.7 and Troponin 0.57  Date of notification:  2015-04-04  Time of notification:  0930  Critical value read back: Yes   Nurse who received alert:  Tory EmeraldMichelle Toler RN   MD notified (1st page):  NP Brett CanalesSteve Minor   Time of first page:  0930  MD notified (2nd page):  Time of second page:  Responding MD:  NP Brett CanalesSteve Minor  Time MD responded:  0930

## 2015-07-22 ENCOUNTER — Encounter (HOSPITAL_COMMUNITY): Payer: Commercial Managed Care - HMO

## 2015-07-22 DIAGNOSIS — R6521 Severe sepsis with septic shock: Secondary | ICD-10-CM

## 2015-07-22 DIAGNOSIS — A419 Sepsis, unspecified organism: Principal | ICD-10-CM

## 2015-07-22 DIAGNOSIS — E872 Acidosis: Secondary | ICD-10-CM

## 2015-07-22 LAB — ECHOCARDIOGRAM COMPLETE
E decel time: 151 msec
FS: 22 % — AB (ref 28–44)
HEIGHTINCHES: 70 in
IVS/LV PW RATIO, ED: 0.97
LA ID, A-P, ES: 40 mm
LA diam end sys: 40 mm
LA diam index: 1.63 cm/m2
LA vol A4C: 48.5 ml
LDCA: 3.46 cm2
LV SIMPSON'S DISK: 13
LV dias vol index: 78 mL/m2
LV dias vol: 193 mL — AB (ref 62–150)
LV sys vol index: 68 mL/m2
LV sys vol: 168 mL — AB (ref 21–61)
LVOTD: 21 mm
MV Dec: 151
MVPG: 3 mmHg
MVPKEVEL: 92.6 m/s
PW: 11.8 mm — AB (ref 0.6–1.1)
RV TAPSE: 15.3 mm
Reg peak vel: 236 cm/s
Stroke v: 25 ml
TRMAXVEL: 236 cm/s
WEIGHTICAEL: 4691.39 [oz_av]

## 2015-07-22 LAB — BLOOD GAS, ARTERIAL
ACID-BASE DEFICIT: 14.9 mmol/L — AB (ref 0.0–2.0)
Bicarbonate: 11.3 mEq/L — ABNORMAL LOW (ref 20.0–24.0)
Drawn by: 24513
FIO2: 40
MECHVT: 500 mL
O2 SAT: 96.2 %
PATIENT TEMPERATURE: 97.5
PCO2 ART: 27.7 mmHg — AB (ref 35.0–45.0)
PEEP/CPAP: 5 cmH2O
PH ART: 7.23 — AB (ref 7.350–7.450)
PO2 ART: 112 mmHg — AB (ref 80.0–100.0)
RATE: 24 resp/min
TCO2: 12.1 mmol/L (ref 0–100)

## 2015-07-22 LAB — GLUCOSE, CAPILLARY
GLUCOSE-CAPILLARY: 62 mg/dL — AB (ref 65–99)
GLUCOSE-CAPILLARY: 70 mg/dL (ref 65–99)
GLUCOSE-CAPILLARY: 142 mg/dL — AB (ref 65–99)
GLUCOSE-CAPILLARY: 134 mg/dL — AB (ref 65–99)
Glucose-Capillary: 62 mg/dL — ABNORMAL LOW (ref 65–99)
Glucose-Capillary: 93 mg/dL (ref 65–99)
Glucose-Capillary: 52 mg/dL — ABNORMAL LOW (ref 65–99)
Glucose-Capillary: 77 mg/dL (ref 65–99)

## 2015-07-22 LAB — POCT I-STAT 3, ART BLOOD GAS (G3+)
Acid-base deficit: 17 mmol/L — ABNORMAL HIGH (ref 0.0–2.0)
Bicarbonate: 10 mEq/L — ABNORMAL LOW (ref 20.0–24.0)
O2 SAT: 96 %
PCO2 ART: 27.8 mmHg — AB (ref 35.0–45.0)
PO2 ART: 103 mmHg — AB (ref 80.0–100.0)
Patient temperature: 97.4
TCO2: 11 mmol/L (ref 0–100)
pH, Arterial: 7.159 — CL (ref 7.350–7.450)

## 2015-07-22 LAB — POCT ACTIVATED CLOTTING TIME
ACTIVATED CLOTTING TIME: 180 s
ACTIVATED CLOTTING TIME: 180 s
ACTIVATED CLOTTING TIME: 180 s
ACTIVATED CLOTTING TIME: 186 s
ACTIVATED CLOTTING TIME: 191 s
ACTIVATED CLOTTING TIME: 197 s
ACTIVATED CLOTTING TIME: 197 s
ACTIVATED CLOTTING TIME: 202 s
Activated Clotting Time: 235 seconds
Activated Clotting Time: 180 seconds
Activated Clotting Time: 169 seconds
Activated Clotting Time: 169 seconds
Activated Clotting Time: 175 seconds
Activated Clotting Time: 175 seconds
Activated Clotting Time: 186 seconds
Activated Clotting Time: 202 seconds
Activated Clotting Time: 186 seconds
Activated Clotting Time: 202 seconds

## 2015-07-22 LAB — APTT: APTT: 83 s — AB (ref 24–37)

## 2015-07-22 LAB — CBC
HCT: 50.7 % (ref 39.0–52.0)
HEMOGLOBIN: 15.7 g/dL (ref 13.0–17.0)
MCH: 27.7 pg (ref 26.0–34.0)
MCHC: 31 g/dL (ref 30.0–36.0)
MCV: 89.4 fL (ref 78.0–100.0)
PLATELETS: 164 10*3/uL (ref 150–400)
RBC: 5.67 MIL/uL (ref 4.22–5.81)
RDW: 15.9 % — ABNORMAL HIGH (ref 11.5–15.5)
WBC: 23.2 10*3/uL — ABNORMAL HIGH (ref 4.0–10.5)

## 2015-07-22 LAB — MAGNESIUM: MAGNESIUM: 2.2 mg/dL (ref 1.7–2.4)

## 2015-07-22 LAB — RENAL FUNCTION PANEL
Albumin: 2.6 g/dL — ABNORMAL LOW (ref 3.5–5.0)
Albumin: 2.6 g/dL — ABNORMAL LOW (ref 3.5–5.0)
Anion gap: 29 — ABNORMAL HIGH (ref 5–15)
Anion gap: 31 — ABNORMAL HIGH (ref 5–15)
BUN: 67 mg/dL — AB (ref 6–20)
BUN: 56 mg/dL — ABNORMAL HIGH (ref 6–20)
CHLORIDE: 94 mmol/L — AB (ref 101–111)
CHLORIDE: 94 mmol/L — AB (ref 101–111)
CO2: 12 mmol/L — AB (ref 22–32)
CO2: 11 mmol/L — AB (ref 22–32)
CREATININE: 3.52 mg/dL — AB (ref 0.61–1.24)
CREATININE: 3.24 mg/dL — AB (ref 0.61–1.24)
Calcium: 6.5 mg/dL — ABNORMAL LOW (ref 8.9–10.3)
Calcium: 6.5 mg/dL — ABNORMAL LOW (ref 8.9–10.3)
GFR calc Af Amer: 19 mL/min — ABNORMAL LOW (ref >60)
GFR calc non Af Amer: 16 mL/min — ABNORMAL LOW (ref >60)
GFR calc non Af Amer: 18 mL/min — ABNORMAL LOW (ref >60)
GFR, EST AFRICAN AMERICAN: 21 mL/min — AB (ref >60)
Glucose, Bld: 63 mg/dL — ABNORMAL LOW (ref 65–99)
Glucose, Bld: 76 mg/dL (ref 65–99)
POTASSIUM: 5 mmol/L (ref 3.5–5.1)
POTASSIUM: 5.5 mmol/L — AB (ref 3.5–5.1)
Phosphorus: 9.2 mg/dL — ABNORMAL HIGH (ref 2.5–4.6)
Phosphorus: 9.6 mg/dL — ABNORMAL HIGH (ref 2.5–4.6)
Sodium: 135 mmol/L (ref 135–145)
Sodium: 136 mmol/L (ref 135–145)

## 2015-07-22 LAB — PROTIME-INR
INR: 5.41 (ref 0.00–1.49)
Prothrombin Time: 47.7 seconds — ABNORMAL HIGH (ref 11.6–15.2)

## 2015-07-22 LAB — HEPARIN LEVEL (UNFRACTIONATED): HEPARIN UNFRACTIONATED: 0.12 [IU]/mL — AB (ref 0.30–0.70)

## 2015-07-22 LAB — VANCOMYCIN, RANDOM: Vancomycin Rm: 21 ug/mL

## 2015-07-22 LAB — STREP PNEUMONIAE URINARY ANTIGEN: Strep Pneumo Urinary Antigen: NEGATIVE

## 2015-07-22 LAB — LACTIC ACID, PLASMA: Lactic Acid, Venous: 16.3 mmol/L (ref 0.5–2.0)

## 2015-07-22 MED ORDER — STERILE WATER FOR INJECTION IV SOLN
INTRAVENOUS | Status: DC
  Administered 2015-07-22 (×3): via INTRAVENOUS_CENTRAL
  Filled 2015-07-22 (×5): qty 150

## 2015-07-22 MED ORDER — PIPERACILLIN-TAZOBACTAM 3.375 G IVPB
3.3750 g | Freq: Four times a day (QID) | INTRAVENOUS | Status: DC
  Filled 2015-07-22 (×4): qty 50

## 2015-07-22 MED ORDER — MORPHINE SULFATE 25 MG/ML IV SOLN
1.0000 mg/h | INTRAVENOUS | Status: DC
  Administered 2015-07-22: 10 mg/h via INTRAVENOUS
  Filled 2015-07-22: qty 10

## 2015-07-22 MED ORDER — DEXTROSE 50 % IV SOLN
INTRAVENOUS | Status: AC
  Filled 2015-07-22: qty 50

## 2015-07-22 MED ORDER — LORAZEPAM 2 MG/ML IJ SOLN
1.0000 mg/h | INTRAVENOUS | Status: DC
  Administered 2015-07-22: 5 mg/h via INTRAVENOUS
  Filled 2015-07-22: qty 25

## 2015-07-22 MED ORDER — DEXTROSE 50 % IV SOLN
50.0000 mL | Freq: Once | INTRAVENOUS | Status: AC
  Administered 2015-07-22: 50 mL via INTRAVENOUS

## 2015-07-22 MED ORDER — PRO-STAT SUGAR FREE PO LIQD
30.0000 mL | Freq: Two times a day (BID) | ORAL | Status: DC
  Administered 2015-07-22: 30 mL
  Filled 2015-07-22 (×2): qty 30

## 2015-07-22 MED ORDER — VANCOMYCIN HCL 10 G IV SOLR
1250.0000 mg | INTRAVENOUS | Status: DC
  Administered 2015-07-22: 1250 mg via INTRAVENOUS
  Filled 2015-07-22: qty 1250

## 2015-07-22 MED ORDER — LORAZEPAM BOLUS VIA INFUSION
2.0000 mg | INTRAVENOUS | Status: DC | PRN
  Filled 2015-07-22: qty 5

## 2015-07-22 MED ORDER — MORPHINE BOLUS VIA INFUSION
5.0000 mg | INTRAVENOUS | Status: DC | PRN
  Filled 2015-07-22: qty 20

## 2015-07-22 MED ORDER — SODIUM BICARBONATE 8.4 % IV SOLN
INTRAVENOUS | Status: DC
  Administered 2015-07-22 (×2): via INTRAVENOUS_CENTRAL
  Filled 2015-07-22 (×8): qty 150

## 2015-07-22 MED ORDER — VITAMIN K1 10 MG/ML IJ SOLN
10.0000 mg | Freq: Once | INTRAVENOUS | Status: AC
  Administered 2015-07-22: 10 mg via INTRAVENOUS
  Filled 2015-07-22: qty 1

## 2015-07-22 MED ORDER — VITAL HIGH PROTEIN PO LIQD
1000.0000 mL | ORAL | Status: DC
  Administered 2015-07-22: 1000 mL

## 2015-07-23 LAB — URINE CULTURE
Culture: NO GROWTH
SPECIAL REQUESTS: NORMAL

## 2015-07-24 LAB — GLUCOSE, CAPILLARY: GLUCOSE-CAPILLARY: 87 mg/dL (ref 65–99)

## 2015-07-25 LAB — HEMOGLOBIN A1C
Hgb A1c MFr Bld: 6.7 % — ABNORMAL HIGH (ref 4.8–5.6)
Mean Plasma Glucose: 146 mg/dL

## 2015-07-26 LAB — CULTURE, BLOOD (ROUTINE X 2)
Culture: NO GROWTH
Culture: NO GROWTH

## 2015-07-27 ENCOUNTER — Telehealth: Payer: Self-pay

## 2015-07-27 NOTE — Telephone Encounter (Signed)
On 07/27/2015 I received a death certificate from [facility] (original). The death certificate is for burial. The patient is a patient of Doctor Molli KnockYacoub. The [facility] ([facility] for signature. On 07/27/2015 I received the death certificate back from Doctor Molli KnockYacoub. I got the death certificate ready and called the funeral home to let them know the death certificate was [facility] per their request.

## 2015-08-05 NOTE — Progress Notes (Addendum)
Notified MD Schertz with Nephrology in regards to patient's potassium being 5.5 and increased acidosis from ABG. MD Schertz said to increase rate of Post to 200 ml/hr, Pre to 150 meq of Sodium Bicarb at a rate of 400, and dialysate at 2000 ml/hr. Also to obtain BMET and ABG within 4-6hrs of changing rate/doses. Will continue to monitor and assess.

## 2015-08-05 NOTE — Progress Notes (Signed)
eLink Physician-Brief Progress Note Patient Name: Marcelo BaldyHarry W Schuler DOB: Nov 29, 1945 MRN: [id number]   Date of Service  07/09/2015  HPI/Events of Note  Pt's family has come to accept that he can not survive this illness, and have requested to transition to comfort care.   eICU Interventions  Order set entered for vent withdrawal and DNR status.      Intervention Category Major Interventions: Other:  Earnest Mcgillis 07/13/2015, 7:22 PM

## 2015-08-05 NOTE — Progress Notes (Signed)
Still unable to obtain sputum for sputum sample ordered.    Also, no cough or gag noted.  RN aware.

## 2015-08-05 NOTE — Accreditation Note (Signed)
o Restraints not reported to CMS Pursuant to regulation 482.13 (G) (3) use of soft wrist restraints was logged on 06.19.2017 at 0857 by Rosine DoorAngus Shadow Stiggers, RN, Patient Safety and Accreditation.

## 2015-08-05 NOTE — Progress Notes (Signed)
PULMONARY / CRITICAL CARE MEDICINE   Name: Nathan Eaton MRN: [id number] DOB: 08/26/1945    ADMISSION DATE:  2015-08-13   REFERRING MD: [place]  CHIEF COMPLAINT:  Sepsis  HISTORY OF PRESENT ILLNESS:   70 yo WM with an extensive PMH that includes  CHF ef 30%, DM, HTN, MO 297 ibs, COPD still smokes, venous stasis, PE on xarelto, Afib with RVR. He was admitted to [facility] 6/13 with 30 day hx of increasing lower ext edema and SOB. On 6/15 he rapidly declined and developed shock(presumed sepsis from aspiration)and required intubation and pressors. He has baseline creatine of 1.4 and rose to 2.3. He was transferred via [facility] to MICU on 40 mcg of levo, vasopressin and acidotic.    SUBJECTIVE:  MOWM on vent  VITAL SIGNS: BP 96/56 mmHg  Pulse 103  Temp(Src) 97.4 F (36.3 C) (Oral)  Resp 28  Ht  (1.778 m)  Wt 299 lb 9.7 oz (135.9 kg)  BMI 42.99 kg/m2  SpO2 88%  HEMODYNAMICS:    VENTILATOR SETTINGS: Vent Mode:  [-] PRVC FiO2 (%):  [40 %] 40 % Set Rate:  [24 bmp] 24 bmp Vt Set:  [500 mL] 500 mL PEEP:  [5 cmH20] 5 cmH20 Plateau Pressure:  [14 cmH20-18 cmH20] 16 cmH20  INTAKE / OUTPUT: I/O last 3 completed shifts: In: 1592.2 [I.V.:1202.2; Other:50; NG/GT:90; IV Piggyback:250] Out: 799 [Urine:10; Other:789]  PHYSICAL EXAMINATION: General:MOWM on vent and sedated Neuro:  Unresponsive except to sternal rub HEENT:  OTT-> vent, OGT LIWS, Full beard Cardiovascular:  HSR RRR Lungs:Coarse rhonchi, decreased bs bases, faint wheezes Abdomen:Obese +bs Musculoskeletal:  intact Skin:  Lower ext with venous statis , weeping ulcers, no palpable lower ext pulses. Mottled all over   LABS:  BMET  Recent Labs Lab August 13, 2015 0800 08-13-15 1701 07/10/2015 0400  NA 138 135 135  K 4.3 4.6 5.0  CL 89* 92* 94*  CO2 21* 16* 12*  BUN 79* 84* 67*  CREATININE 3.69* 4.00* 3.52*  GLUCOSE 109* 80 63*    Electrolytes  Recent Labs Lab 2015-08-13 0800 August 13, 2015 1701  07/08/2015 0400  CALCIUM 7.4* 6.5* 6.5*  MG 1.9  --  2.2  PHOS 10.9* 10.6* 9.2*    CBC  Recent Labs Lab 08-13-2015 0800 Aug 13, 2015 1813 07/11/2015 0400  WBC 23.0*  --  23.2*  HGB 16.4  --  15.7  HCT 51.9  --  50.7  PLT 206 177 164    Coag's  Recent Labs Lab 2015-08-13 0800 08-13-2015 1813 07/17/2015 0400  APTT 58* 40* 83*  INR 3.02* 3.80* 5.41*    Sepsis Markers  Recent Labs Lab August 13, 2015 0800 August 13, 2015 1423  LATICACIDVEN 8.7* 11.0*  PROCALCITON 2.17  --     ABG  Recent Labs Lab 08/13/15 0752 13-Aug-2015 1344 07/11/2015 0306  PHART 7.296* 7.284* 7.230*  PCO2ART 43.2 35.5 27.7*  PO2ART 126.0* 95.0 112*    Liver Enzymes  Recent Labs Lab Aug 13, 2015 0800 08/13/2015 1701 07/14/2015 0400  AST 7292*  --   --   ALT 2949*  --   --   ALKPHOS 121  --   --   BILITOT 3.4*  --   --   ALBUMIN 2.7* 2.6* 2.6*    Cardiac Enzymes  Recent Labs Lab 2015-08-13 0800 08-13-2015 1424 08/13/2015 2230  TROPONINI 0.57* 0.84* 1.03*    Glucose  Recent Labs Lab 07/08/2015 0406 08/03/2015 0510 07/15/2015 0537 07/21/2015 0816 07/21/2015 1214 07/28/2015 1244  GLUCAP 70 62* 142* 93 52*  134*    Imaging No results found.   STUDIES:  6/16 2 d>> 6/16 le a/v doppler>>   CULTURES: 6/15 bc x 2>> 6/15 uc> 6/15 sputum>>  ANTIBIOTICS: 6/15 vanco>> 6/15 zoysn>>  SIGNIFICANT EVENTS: 6/13 [facility] with pna resp distress 6/15 required intubation , pressor support and tx to [facility].  LINES/TUBES: 6/15 ott>> 6/15 rij cvl>> 6/15 rt fem aline>>  DISCUSSION: 70 yo MO (296 lbs) with multiple medical problems with shock, sepsis, pna, renal failure, tx from [facility] 6/16. Remains in MODs doubt he will survive this. Will cont supportive care.   ASSESSMENT / PLAN:  PULMONARY A: Acute hypoxic resp failure Presumed aspiration pna COPD with continued tobacco abuse Asthma P:   Ve adjusted for metabolic acidosis Will need to recheck abg to avoid alkalosis as  metabolic status improved.  Vent bundle BD's  CARDIOVASCULAR A:  Shock from presumed sepsis-->now MODS CHF Known ef of 30 % Afib with RVR Lower ext venous statis and no pulse P:  Pressor support; cont levo and vasopressin  Careful with fluids Cont stress dose steroids (on chronic pred) F/u Lower ext doppler studies   RENAL A:   Acute renal failure with base line creatine 1.8 Severe metabolic acidosis/lactic acidosis  P:   crrt per renal Trend chemistry  Strict I&O Repeat LA   GASTROINTESTINAL A:   Hepatic congestion -->shock liver  P:   H2 blocker F/u LFTs   HEMATOLOGIC A:   Anticoagulation for PE Coagulopathy -->shock liver  P:  Heparin per pharmacy  Vit K X 1   INFECTIOUS A:   Aspiration pneumonia Weeping lower extremities  P:   V/Z day 1 Pan culture 6/16  ENDOCRINE A:   DM  Adrenal suppresion P:   SSI Solu cortef   NEUROLOGIC A:   Toxic metabolic encephalopathy  P:   RASS goal:-1 Sedate for tube tolerance   FAMILY  - Updates:   - Inter-disciplinary family meet or Palliative Care meeting due by:  June 23  Simonne MartinetPeter E Babcock ACNP-BC [facility] Pulmonary/Critical Care Pager # [phone] OR # [phone] if no answer  Attending Note:  70 year old male with septic shock from pneumonia and suspect cardiogenic shock as well.  Subsequently developed renal failure and CRRT was started.  Pressors demand rising and now in liver failure due to shocked liver.  I suspect he will continue to deteriorate and doubtful he will survive this admission.  Dr. Vassie LollAlva spoke with wife yesterday, insists on full code status.  ABG with severe metabolic acidosis, will notify renal.  Unfortunately there is little else that can be done about this.  The patient is critically ill with multiple organ systems failure and requires high complexity decision making for assessment and support, frequent evaluation and titration of therapies, application of advanced monitoring  technologies and extensive interpretation of multiple databases.   Critical Care Time devoted to patient care services described in this note is  35  Minutes. This time reflects time of care of this signee Dr Koren BoundWesam Toby Ayad. This critical care time does not reflect procedure time, or teaching time or supervisory time of PA/NP/Med student/Med Resident etc but could involve care discussion time.  Alyson ReedyWesam G. Callyn Severtson, M.D. [facility] Pulmonary/Critical Care Medicine. Pager: [phone]. After hours pager: [phone].

## 2015-08-05 NOTE — Progress Notes (Signed)
Subjective:  Some difficulty initiating CRRT last night due to clotting and increased filter pressures.  Able to get started and keep going with [facility] heparin- INR is up- more acidotic- lactate up and requiring more pressor support Objective Vital signs in last 24 hours: Filed Vitals:   August 01, 2015 0630 08-01-2015 0645 08-01-15 0700 2015-08-01 0715  BP:      Pulse:  27    Temp:      TempSrc:      Resp: 27 28 27 27   Height:      Weight:      SpO2:  53%     Weight change:   Intake/Output Summary (Last 24 hours) at 08/01/2015 0730 Last data filed at 08-01-15 0701  Gross per 24 hour  Intake 1592.2 ml  Output    799 ml  Net  793.2 ml    Assessment/Plan: 70 year old white male with multiple medical issues with an acute clinical decline requiring intubation and pressor support with accompanying acute kidney injury on chronic kidney failure 1.Renal- acute on chronic renal failure in the setting of acute critical illness. I suspect secondary to ATN - previously on lisinopril, also contrast, and supratherapeutic vancomycin level.Patient is an anuric. CRRT initiated 6/16- some technical difficulty with machine due to clotting but able to get to run with Eaton lot of heparin and increased pre filter flow. 2. Hypertension/volume - do not suspect he is dry. However, is hypertensive and tachycardic receiving fluids per sepsis protocol- will run even with CRRT- CCM can bolus outside the machine 3. Anemia - does not appear to be an issue at this time- using lots of  heparin with CRRT 4. Sepsis- elevated WBC- unknown source on zosyn and vanc 5. Elytes- phos is up- CRRT will help clear- K is OK - will run on 4 K bath - corrected calcium is OK  6. Acidosis- due to lactate- will change replacement fluid to bicarb- should help K as well  7. Dispo-  poor prognosis with baseline chronic illness and now multi system organ failure    Nathan Eaton    Labs: Basic Metabolic Panel:  Recent Labs Lab  07/12/2015 0800 07/11/2015 1701 August 01, 2015 0400  NA 138 135 135  K 4.3 4.6 5.0  CL 89* 92* 94*  CO2 21* 16* 12*  GLUCOSE 109* 80 63*  BUN 79* 84* 67*  CREATININE 3.69* 4.00* 3.52*  CALCIUM 7.4* 6.5* 6.5*  PHOS 10.9* 10.6* 9.2*   Liver Function Tests:  Recent Labs Lab 07/06/2015 0800 07/11/2015 1701 08-01-2015 0400  AST 7292*  --   --   ALT 2949*  --   --   ALKPHOS 121  --   --   BILITOT 3.4*  --   --   PROT 5.6*  --   --   ALBUMIN 2.7* 2.6* 2.6*    Recent Labs Lab 07/19/2015 0800  LIPASE 172*  AMYLASE 339*   No results for input(s): AMMONIA in the last 168 hours. CBC:  Recent Labs Lab 07/29/2015 0800 07/26/2015 1813 08/01/2015 0400  WBC 23.0*  --  23.2*  HGB 16.4  --  15.7  HCT 51.9  --  50.7  MCV 88.7  --  89.4  PLT 206 177 164   Cardiac Enzymes:  Recent Labs Lab 07/09/2015 0800 07/20/2015 1424 07/28/2015 2230  TROPONINI 0.57* 0.84* 1.03*   CBG:  Recent Labs Lab 07/09/2015 2313 2015/08/01 0404 August 01, 2015 0406 August 01, 2015 0510 08-01-15 0537  GLUCAP 76 62* 70 62* 142*  Iron Studies: No results for input(s): IRON, TIBC, TRANSFERRIN, FERRITIN in the last 72 hours. Studies/Results: Dg Chest Port 1 View  07/23/2015  CLINICAL DATA:  Check endotracheal tube placement EXAM: PORTABLE CHEST 1 VIEW COMPARISON:  07/07/2015 FINDINGS: Cardiac shadow remains enlarged. An endotracheal tube is noted in satisfactory position approximately 5.8 cm above the carina. Eaton right central line is noted in the mid superior vena cava. Nasogastric catheter courses toward stomach. Left basilar atelectasis and effusion is noted increased from the prior exam. Eaton small right-sided pleural effusion is noted. IMPRESSION: Tubes and lines as described above. Bibasilar changes left greater than right. Electronically Signed   By: Inez Catalina M.D.   On: 07/20/2015 09:05   Dg Abd Portable 1v  07/07/2015  CLINICAL DATA:  Nasogastric tube placement EXAM: PORTABLE ABDOMEN - 1 VIEW COMPARISON:  CT abdomen and pelvis  October 15, 2004 FINDINGS: Nasogastric tube tip and side port are in the stomach. There is Eaton relative paucity of gas. There is airspace consolidation the left base. No free air. IMPRESSION: Nasogastric tube tip and side port in stomach. Paucity of bowel gas noted. This finding may be seen normally but also may be indicative of enteritis or early ileus. No free air evident. Left lower lobe airspace consolidation noted. Electronically Signed   By: Lowella Grip III M.D.   On: 07/25/2015 09:03   Medications: Infusions: . sodium chloride 20 mL/hr at 07/13/2015 2000  . heparin 10,000 units/ 20 mL infusion syringe 1,550 Units/hr (11-Aug-2015 0701)  . norepinephrine (LEVOPHED) Adult infusion 38 mcg/min (08/11/2015 0333)  . dialysis replacement fluid (prismasate) 500 mL/hr at 08-11-2015 0532  . dialysis replacement fluid (prismasate)    . dialysate (PRISMASATE) 1,500 mL/hr at 08-11-2015 0549  . vasopressin (PITRESSIN) infusion - *FOR SHOCK* 0.03 Units/min (07/20/2015 1900)    Scheduled Medications: . antiseptic oral rinse  7 mL Mouth Rinse 10 times per day  . chlorhexidine gluconate (SAGE KIT)  15 mL Mouth Rinse BID  . famotidine (PEPCID) IV  20 mg Intravenous Q24H  . hydrocortisone sod succinate (SOLU-CORTEF) inj  50 mg Intravenous Q6H  . insulin aspart  0-15 Units Subcutaneous Q4H  . ipratropium-albuterol  3 mL Nebulization Q6H  . piperacillin-tazobactam (ZOSYN)  IV  2.25 g Intravenous Q6H    have reviewed scheduled and prn medications.  Physical Exam: General: sedated on vent Heart: tachy Lungs: CBS bilat Abdomen: distended Extremities: discolored- ulcerated Dialysis Access: femoral HD cath placed 6/16    2015/08/11,7:30 AM  LOS: 1 day

## 2015-08-05 NOTE — Progress Notes (Signed)
Pt expired at 2110 07/17/2015. Family at bedside. Family [facility] [place]

## 2015-08-05 NOTE — Progress Notes (Signed)
Wasted 35 cc's Ativan and 235 cc's of morphine in sink witnessed by Darci NeedleGary Yeung RN.

## 2015-08-05 NOTE — Progress Notes (Signed)
Pharmacy Antibiotic Note  Nathan Eaton is a 70 y.o. male admitted on 07/28/2015 with sepsis.  Pharmacy has been consulted for vancomycin/Zosyn dosing.  Wbc 23.2, afeb. Started on CRRT 6/16. [facility]. Vancomycin random level 21 mcg/ml this morning. Will restart scheduled dosing to maintain within therapeutic range   Plan: Vancomycin 1250mg  q24h Zosyn 2.25g q6h while on CRRT VT at steady state F/u cx, clinical status, renal function  Height: 5\' 10"  (177.8 cm) Weight: 299 lb 9.7 oz (135.9 kg) IBW/kg (Calculated) : 73  Temp (24hrs), Avg:97.9 F (36.6 C), Min:97.4 F (36.3 C), Max:98.5 F (36.9 C)   Recent Labs Lab 07/09/2015 0800 07/17/2015 1035 07/19/2015 1423 07/30/2015 1701 2015-11-28 0400  WBC 23.0*  --   --   --  23.2*  CREATININE 3.69*  --   --  4.00* 3.52*  LATICACIDVEN 8.7*  --  11.0*  --   --   VANCORANDOM  --  28  --   --  21    Estimated Creatinine Clearance: 27.1 mL/min (by C-G formula based on Cr of 3.52).    Allergies  Allergen Reactions  . Pravachol [Pravastatin Sodium]     Antimicrobials this admission: 6/13 Zosyn >> 6/15, 6/16 >> 6/14 vanc >> 6/15 cefazolin >>  Dose adjustments this admission: Zosyn to 2.25g q6h for CRRT on 6/16  Microbiology results: [facility] 6/16 blood 6/16 urine 6/16 TA 6/16 MRSA screen neg  [facility]  6/13 blood NGTD 6/13 urine contaminated 6/13 MRSA screen neg  Thank you for allowing pharmacy to be a part of this patient's care.  Nathan Eaton 2015-10-25 7:38 AM

## 2015-08-05 NOTE — Discharge Summary (Signed)
[name], Nathan                ACCOUNT NO.:  [id number]  MEDICAL RECORD NO.:  [id number]  LOCATION:  2M15C                        FACILITY:  [facility]  PHYSICIAN:  Nathan EvenerWesam Jake Inaki Vantine, MD  DATE OF BIRTH:  02-10-1945  DATE OF ADMISSION:  January 02, 2016 DATE OF DISCHARGE:  08/01/2015                              DISCHARGE SUMMARY   PRIMARY DIAGNOSIS/CAUSE OF DEATH:  Refractory septic shock.  SECONDARY DIAGNOSES:  Aspiration pneumonia, acute renal failure, refractory acidosis, congestive heart failure, acute on chronic kidney disease, systolic congestive heart failure with ejection fraction 30%, diabetes mellitus, hypertension, morbid obesity, and COPD.  The patient is a 70 year old male with extensive past medical history presumably vomited, aspirated, developed ARDS and with a severe COPD developed respiratory failure.  The patient was [facility]ub[facility] and [facility], where he continued to deteriorate septic shock and acidosis do not respond to treatment.  [facility].  Upon presentation, the patient was on maximum support, started to manifest signs of shock liver.  The wife at that time insisted on full code status; however, at 2:00 p.m., I had a conversation with __________ severity of his illness and has chance of surviving this are very slim, and __________ debility and was informed that there is very little to be done, however, wife insisted on full code status at that time, however, at 7:00 p.m. rather she requested to speak to the __________ physician and expressed wishes for comfort measures at that time.  Comfort measures __________ and the patient was extubated to expire shortly thereafter.     Nathan EvenerWesam Jake Kaci Dillie, MD     WJY/MEDQ  D:  07/26/2015  T:  07/26/2015  Job:  [id number]

## 2015-08-05 NOTE — Progress Notes (Signed)
The patient's wife Kathie Rhodes(Betty) expressed that her "husband would not want this and is ready to move toward a comfort basis". This was witnessed by myself and Dustin Flockhris Hayes RN. MD Sood Notified in regards to.

## 2015-08-05 NOTE — Progress Notes (Signed)
Called re: persistent met acidosis on ABG and also K slightly high.  Severe shock on pressors.  Have changed all replacement fluids to bicarb and increased dialysate to 2L / hr.  Poor prognosis as previously noted.  Will follow.   Kelly Splinter MD [employer] pager [phone]    cell [phone] 2015-08-19, 7:16 PM

## 2015-08-05 NOTE — Progress Notes (Signed)
CRITICAL VALUE ALERT  Critical value received:  cbg-62  Date of notification:  2016/01/16  Time of notification:  0515  Critical value read back:Hypoglycemic Event  CBG: 62  Treatment: D50 IV 50 mL  Symptoms: None  Follow-up CBG: Time: 0530 CBG Result:142  Possible Reasons for Event: inadequate meal intake  Comments/MD notified:Dr. Deterding    Alwyn RenHopper, Dimas AguasMindy Anderson

## 2015-08-05 NOTE — Progress Notes (Signed)
CRITICAL VALUE ALERT  Critical value received:  ABG at 1450  Date of notification:  02-22-2015  Time of notification:  1450  Critical value read back:Yes.    Nurse who received alert:  Hazel Samshristian Smith  MD notified (1st page):  Molli KnockYacoub  Time of first page:  1450  MD notified (2nd page):  Time of second page:  Responding MD:  Molli KnockYacoub  Time MD responded:  1450

## 2015-08-05 NOTE — Progress Notes (Signed)
Notified NP Anders SimmondsPete Babcock in regards to patient having a hypoglycemic event of 52 CBG. Gave 1 ampule of Dextrose 50 at 1224, CBG of 134 after recheck. Also began patient on Pep Up protocol per NP Anders SimmondsPete Babcock. Will continue to monitor and assess.

## 2015-08-05 NOTE — Progress Notes (Addendum)
ANTICOAGULATION CONSULT NOTE - Follow Up Consult  Pharmacy Consult:  Heparin when INR < 2 Indication:  History of recent PE + Afib  Allergies  Allergen Reactions  . Pravachol [Pravastatin Sodium] Other (See Comments)    Severe joint pain    Patient Measurements: Height: 5\' 10"  (177.8 cm) Weight: 299 lb 9.7 oz (135.9 kg) IBW/kg (Calculated) : 73 Heparin Dosing Weight: 105 kg  Vital Signs: Temp: 97.2 F (36.2 C) (06/17 0814) Temp Source: Oral (06/17 0814) BP: 96/56 mmHg (06/17 1130) Pulse Rate: 103 (06/17 1130)  Labs:  Recent Labs  03/23/15 0800 03/23/15 1424 03/23/15 1701 03/23/15 1813 03/23/15 2230 07/30/2015 0400  HGB 16.4  --   --   --   --  15.7  HCT 51.9  --   --   --   --  50.7  PLT 206  --   --  177  --  164  APTT 58*  --   --  40*  --  83*  LABPROT 30.7*  --   --  36.5*  --  47.7*  INR 3.02*  --   --  3.80*  --  5.41*  HEPARINUNFRC 0.10*  --   --   --   --  0.12*  CREATININE 3.69*  --  4.00*  --   --  3.52*  TROPONINI 0.57* 0.84*  --   --  1.03*  --     Estimated Creatinine Clearance: 27.1 mL/min (by C-G formula based on Cr of 3.52).    Assessment: [age] [facility] with complaint of increasing LE edema and SOB.  Patient rapidly declined and [facility]  He has a history of AFib and recent PE on Xarelto PTA.  Pharmacy consulted to transition patient over to IV heparin once INR < 2.  Patient has been here since 2015/07/30 AM and has not received any Xarelto.  His elevated INR is likely due to hepatic congestion/dysfunction.   Goal of Therapy:  Heparin level 0.3-0.7 units/ml Monitor platelets by anticoagulation protocol: Yes    Plan:  - Hold off on starting IV heparin for now; PCCM NP will reassess.  Consider Vitamin K. - Change Zosyn 3.375gm IV Q6H, infuse over 30 min   Naim Murtha D. Laney Potashang, PharmD, BCPS Pager:  [phone] 08/03/2015, 12:10 PM

## 2015-08-05 NOTE — Progress Notes (Signed)
CRITICAL VALUE ALERT  Critical value received:  INR-5.41  Date of notification:  08/01/2015  Time of notification:  0550  Critical value read back:Yes.    Nurse who received alert:  Terrilyn SaverHopper, Baneza Bartoszek Anderson  MD notified (1st page):  Dr. Darrick Pennaeterding  Time of first page:  0550  MD notified (2nd page):  Time of second page:  Responding MD:  Dr. Darrick Pennaeterding  Time MD responded:  [phone]

## 2015-08-05 NOTE — Progress Notes (Signed)
Pt. Extubated per order and per protocol. Family and RN currently at pt. Bedside.

## 2015-08-05 NOTE — Progress Notes (Signed)
CRITICAL VALUE ALERT  Critical value received:  Lactic Acid 16.3  Date of notification:  07/29/2015  Time of notification:  1636  Critical value read back: Yes  Nurse who received alert:  Hazel Samshristian Karlton Maya RN  MD notified (1st page):  MD Craige CottaSood  Time of first page:  1640  MD notified (2nd page):  Time of second page:  Responding MD:  MD Craige CottaSood  Time MD responded:  1640

## 2015-08-05 NOTE — Progress Notes (Signed)
Morphine and Ativan gtts started for comfort prior to extubation. Family in agreement to withdraw with comfort care. Pt extubated at 2105 with family at bedside. Pt appears comfortable at this time.

## 2015-08-05 DEATH — deceased

## 2015-08-11 ENCOUNTER — Telehealth: Payer: Self-pay

## 2015-08-11 NOTE — Telephone Encounter (Signed)
On 08/11/2015 [facility] ([facility] The death certificate is for burial. The patient is a patient of Doctor Molli KnockYacoub. The death certificate will be taken to [facility] on Monday (08/14/15) for signature. The first one that [place] signed got lost in the mail. On 08/15/2015 I received the death certificate back from Doctor Molli KnockYacoub. I got the death certificate ready and [facility] to let [facility] know [facility] was ready for pickup.

## 2017-04-01 IMAGING — CR DG ABD PORTABLE 1V
2 series · 2 of 2 positions shown · non-contrast
Comparison: CT abdomen and pelvis October 15, 2004

CLINICAL DATA: Nasogastric tube placement

EXAM:
PORTABLE ABDOMEN - 1 VIEW

[AP (1 of 2)]
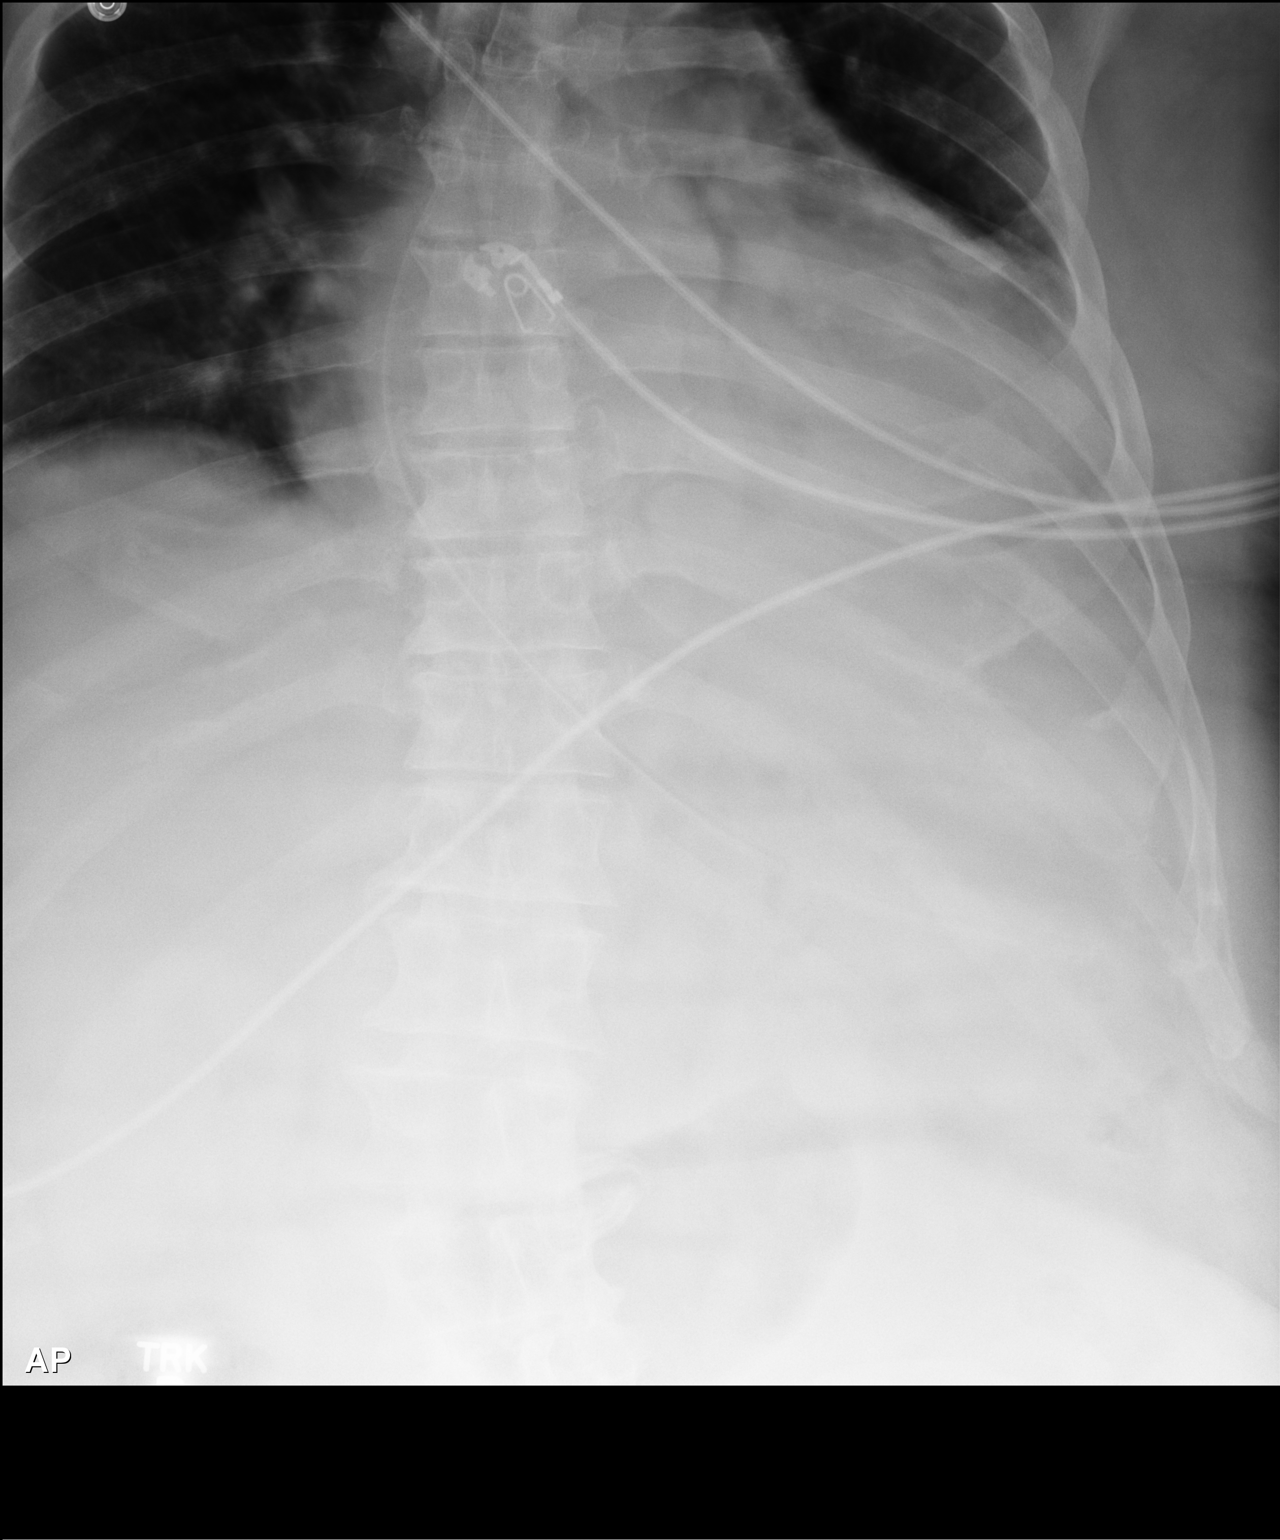

[AP (2 of 2)]
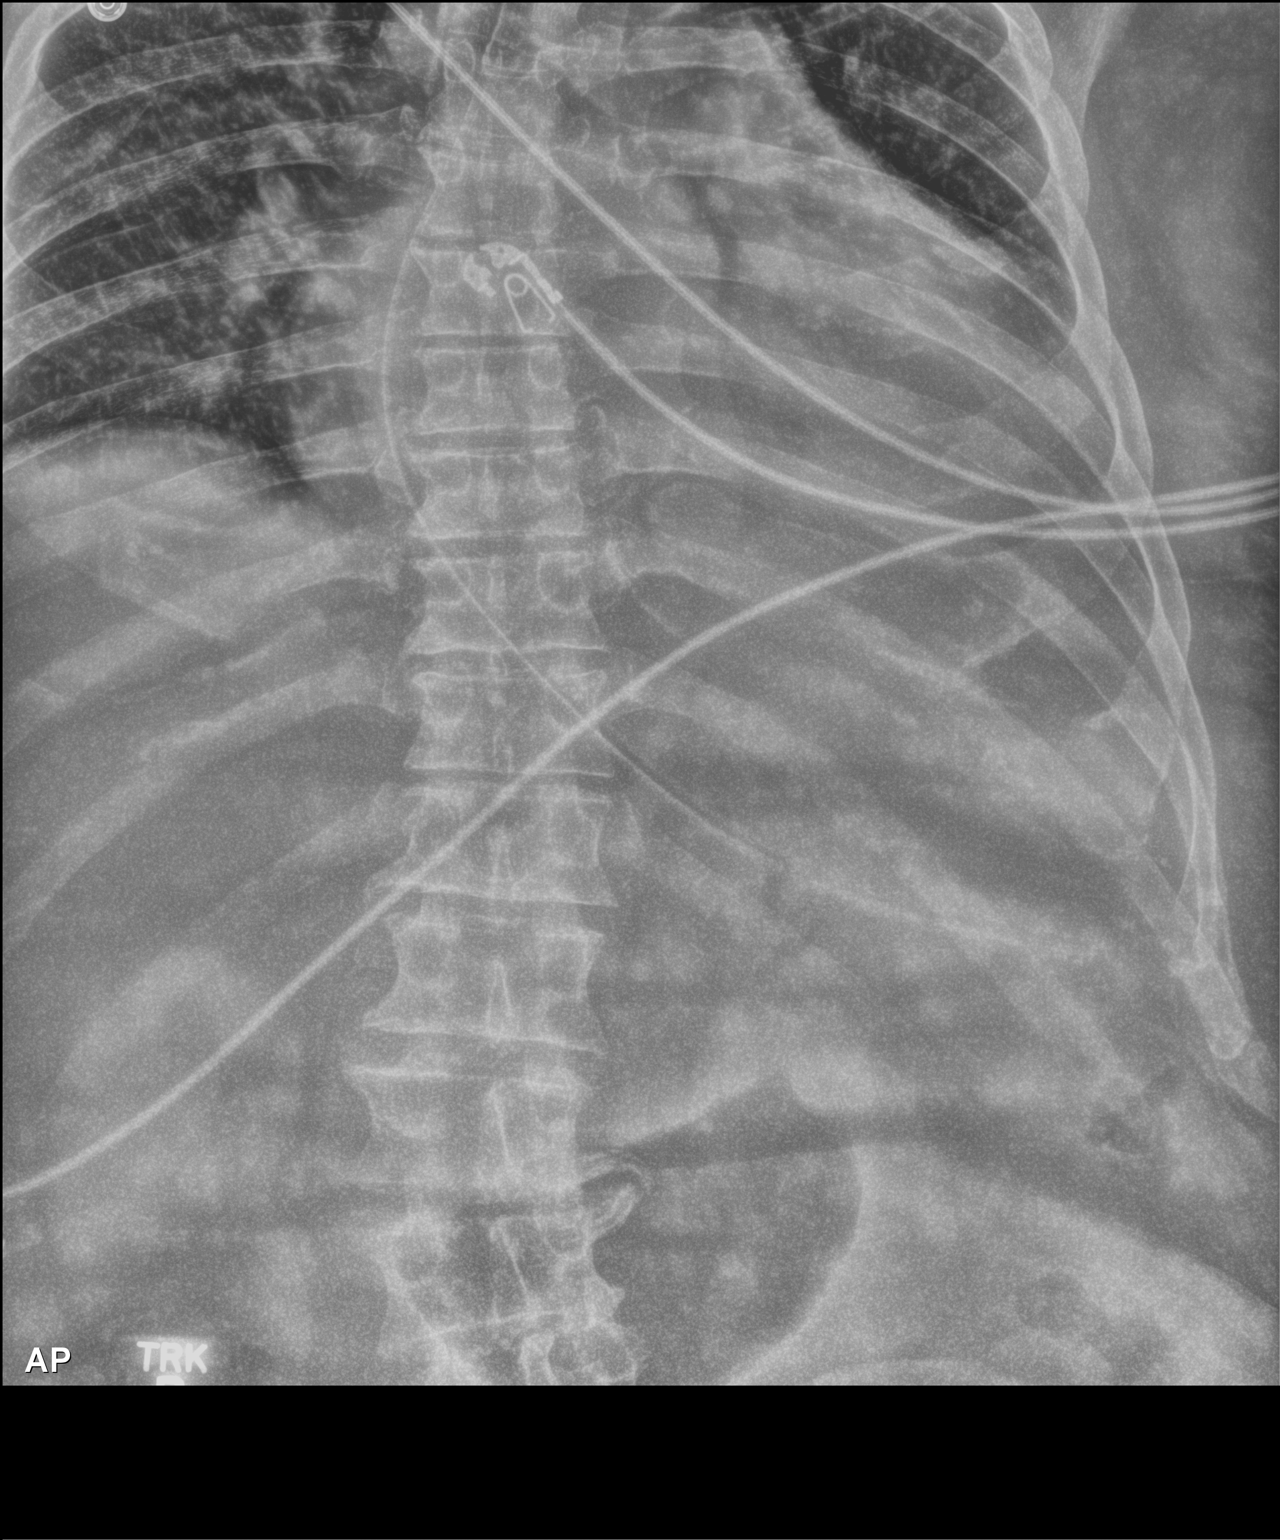

[2 of 2 positions shown; findings below may reference images not displayed]

FINDINGS: Nasogastric tube tip and side port are in the stomach. There is a
relative paucity of gas. There is airspace consolidation the left
base. No free air.
IMPRESSION: Nasogastric tube tip and side port in stomach. Paucity of bowel gas
noted. This finding may be seen normally but also may be indicative
of enteritis or early ileus. No free air evident. Left lower lobe
airspace consolidation noted.

## 2017-04-01 IMAGING — CR DG CHEST 1V PORT
1 series · 1 of 1 positions shown · non-contrast
Comparison: 07/21/2015

CLINICAL DATA: Check endotracheal tube placement

EXAM:
PORTABLE CHEST 1 VIEW

[AP]
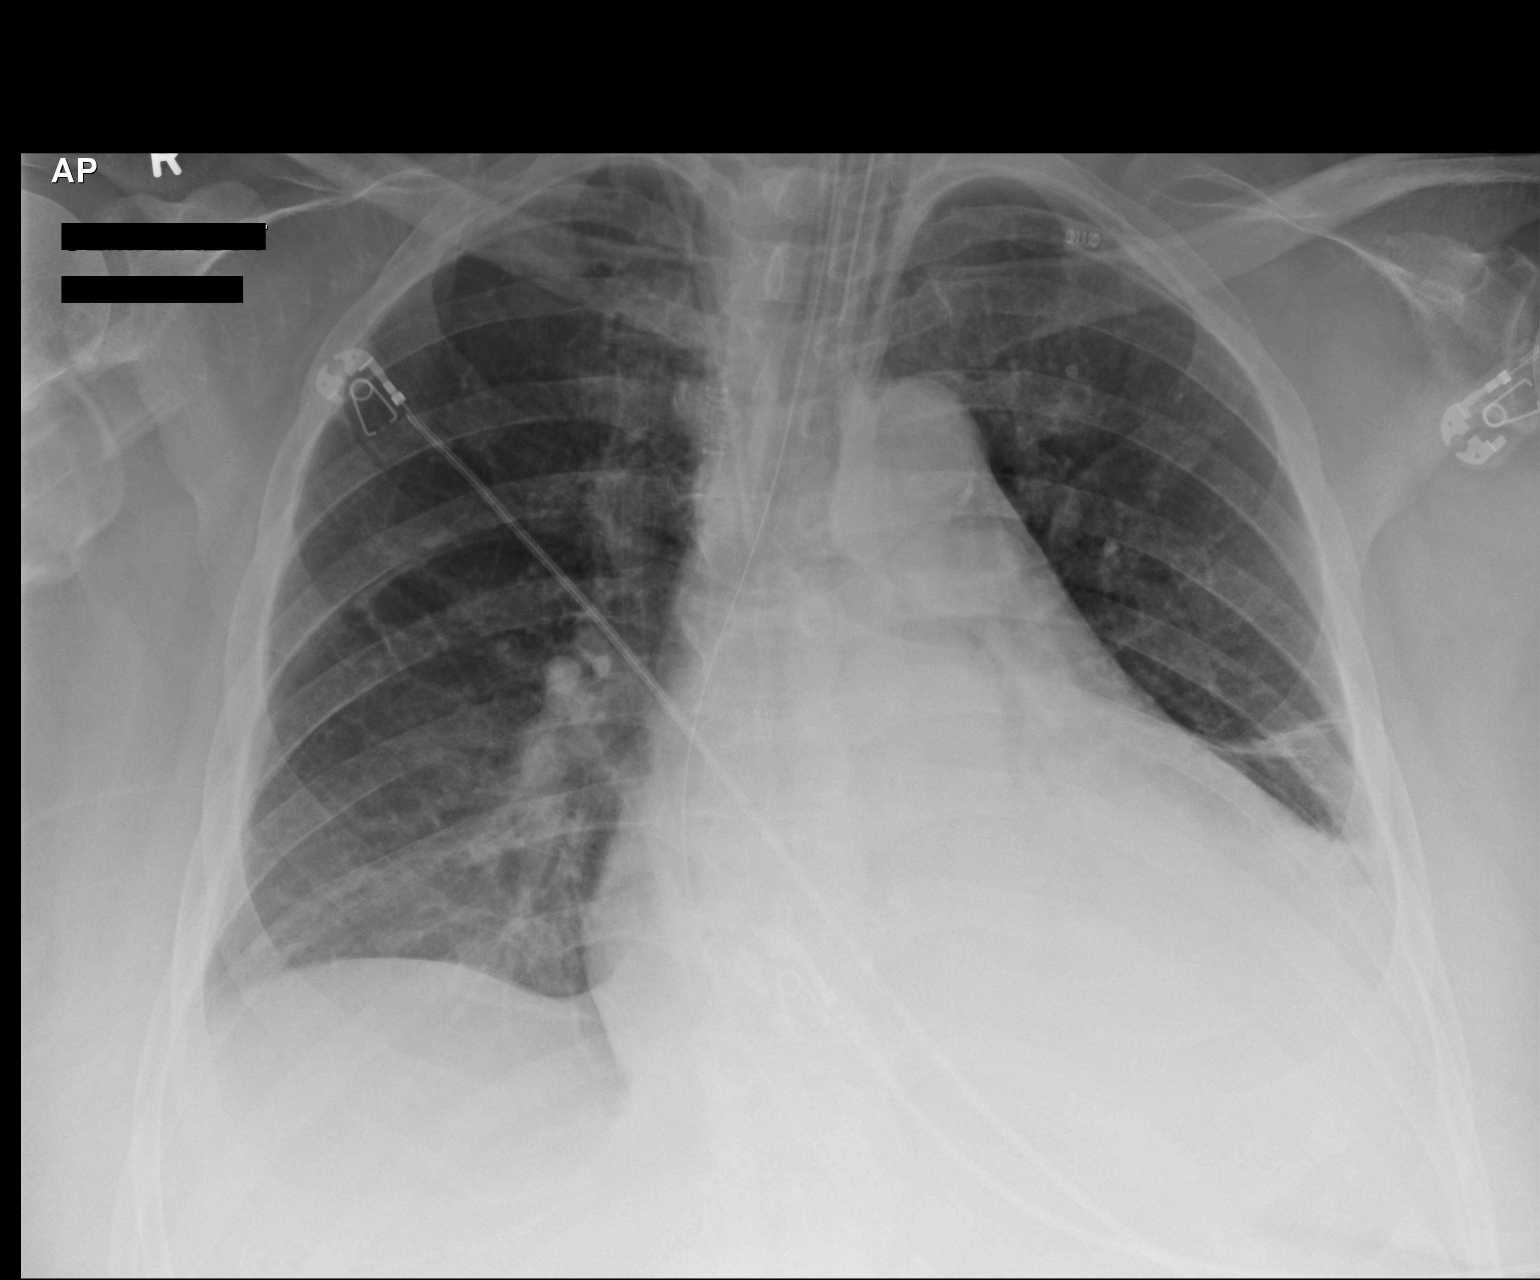

[1 of 1 positions shown; findings below may reference images not displayed]

FINDINGS: Cardiac shadow remains enlarged. An endotracheal tube is noted in
satisfactory position approximately 5.8 cm above the carina. A right
central line is noted in the mid superior vena cava. Nasogastric
catheter courses toward stomach. Left basilar atelectasis and
effusion is noted increased from the prior exam. A small right-sided
pleural effusion is noted.
IMPRESSION: Tubes and lines as described above.

Bibasilar changes left greater than right.
